# Patient Record
Sex: Female | Born: 1965 | Race: White | Hispanic: No | Marital: Married
Health system: Midwestern US, Community
[De-identification: ages and names within clinical notes are randomized; demographics above are authoritative.]

## PROBLEM LIST (undated history)

## (undated) DIAGNOSIS — E785 Hyperlipidemia, unspecified: Secondary | ICD-10-CM

## (undated) DIAGNOSIS — Z8489 Family history of other specified conditions: Secondary | ICD-10-CM

## (undated) DIAGNOSIS — Z87442 Personal history of urinary calculi: Secondary | ICD-10-CM

## (undated) DIAGNOSIS — E119 Type 2 diabetes mellitus without complications: Secondary | ICD-10-CM

## (undated) DIAGNOSIS — Z9889 Other specified postprocedural states: Secondary | ICD-10-CM

## (undated) DIAGNOSIS — K219 Gastro-esophageal reflux disease without esophagitis: Secondary | ICD-10-CM

## (undated) DIAGNOSIS — J45909 Unspecified asthma, uncomplicated: Secondary | ICD-10-CM

## (undated) DIAGNOSIS — T7840XA Allergy, unspecified, initial encounter: Secondary | ICD-10-CM

## (undated) DIAGNOSIS — I10 Essential (primary) hypertension: Secondary | ICD-10-CM

## (undated) DIAGNOSIS — G4730 Sleep apnea, unspecified: Secondary | ICD-10-CM

## (undated) DIAGNOSIS — R112 Nausea with vomiting, unspecified: Secondary | ICD-10-CM

## (undated) HISTORY — DX: Unspecified asthma, uncomplicated: J45.909

## (undated) HISTORY — DX: Allergy, unspecified, initial encounter: T78.40XA

## (undated) HISTORY — PX: OTHER SURGICAL HISTORY: SHX169

## (undated) HISTORY — DX: Essential (primary) hypertension: I10

## (undated) HISTORY — PX: BREAST EXCISIONAL BIOPSY: SUR124

## (undated) HISTORY — PX: ABDOMINAL HYSTERECTOMY: SHX81

## (undated) HISTORY — PX: BREAST SURGERY: SHX581

## (undated) HISTORY — DX: Hyperlipidemia, unspecified: E78.5

---

## 2015-03-22 ENCOUNTER — Inpatient Hospital Stay: Attending: Thoracic Surgery (Cardiothoracic Vascular Surgery)

## 2015-03-22 LAB — CBC
Hematocrit: 38.9 % (ref 36.0–48.0)
Hemoglobin: 12.9 g/dL (ref 12.0–16.0)
MCH: 29.8 pg (ref 26.0–34.0)
MCHC: 33.2 g/dL (ref 31.0–36.0)
MCV: 89.8 fL (ref 80.0–100.0)
MPV: 8 fL (ref 5.0–10.5)
Platelets: 279 10*3/uL (ref 135–450)
RBC: 4.33 M/uL (ref 4.00–5.20)
RDW: 13.8 % (ref 12.4–15.4)
WBC: 7.6 10*3/uL (ref 4.0–11.0)

## 2015-03-22 LAB — TYPE AND SCREEN
ABO/Rh: A POS
Antibody Screen: NEGATIVE

## 2015-03-22 LAB — BASIC METABOLIC PANEL
Anion Gap: 13 (ref 3–16)
BUN: 16 mg/dL (ref 7–20)
CO2: 26 mmol/L (ref 21–32)
Calcium: 9.1 mg/dL (ref 8.3–10.6)
Chloride: 102 mmol/L (ref 99–110)
Creatinine: 0.5 mg/dL — ABNORMAL LOW (ref 0.6–1.1)
GFR African American: >60 (ref >60)
GFR Non-African American: >60 (ref >60)
Glucose: 123 mg/dL — ABNORMAL HIGH (ref 70–99)
Potassium: 4.1 mmol/L (ref 3.5–5.1)
Sodium: 141 mmol/L (ref 136–145)

## 2015-03-25 NOTE — Patient Instructions (Signed)
[facility] PRE-OPERATIVE INSTRUCTIONS    Arrival time_____0600_______        Surgery time_0730___________    Take the following medications with a sip of water:  Use alvesco inhaler and bring albuterol inhaler and Cpap    Do not eat or drink anything after 12:00 midnight prior to your surgery.  This includes water chewing gum, mints and ice chips.   You may brush your teeth and gargle the morning of your surgery, but do not swallow the water     Please see your family doctor/pediatrician for a history and physical and/or concerning medications.   Bring any test results/reports from your physicians office.   If you are under the care of a heart doctor or specialist doctor, please be aware that you may be asked to them for clearance    You may be asked to stop blood thinners such as Coumadin, Plavix, Fragmin, Lovenox, etc., or any anti-inflammatories such as:  Aspirin, Ibuprofen, Advil, Naproxen prior to your surgery.    We also ask that you stop any OTC medications such as fish oil, vitamin E, glucosamine, garlic, Multivitamins, COQ 10, etc.    We ask that you do not smoke 24 hours prior to surgery  We ask that you do not  drink any alcoholic beverages 24 hours prior to surgery     You must make arrangements for a responsible adult to take you home after your surgery.    For your safety you will not be allowed to leave alone or drive yourself home.  Your surgery will be cancelled if you do not have a ride home.     Also for your safety, it is strongly suggested that someone stay with you the first 24 hours after your surgery.     A parent or legal guardian must accompany a child scheduled for surgery and plan to stay at the hospital until the child is discharged.    Please do not bring other children with you.    For your comfort, please wear simple loose fitting clothing to the hospital.  Please do not bring valuables.    Do not wear any make-up or nail polish on your fingers or toes      For your safety,  please do not wear any jewelry or body piercing's on the day of surgery.   All jewelry must be removed.      If you have dentures, they will be removed before going to operating room.    For your convenience, we will provide you with a container.    If you wear contact lenses or glasses, they will be removed, please bring a case for them.     If you have a living will and a durable power of attorney for healthcare, please bring in a copy.     As part of our patient safety program to minimize surgical site infections, we ask you to do the following:    ?? Please notify your surgeon if you develop any illness between         now and the  day of your surgery.    ?? This includes a cough, cold, fever, sore throat, nausea,         or vomiting, and diarrhea, etc.  ??  Please notify your surgeon if you experience dizziness, shortness         of breath or blurred vision between now and the time of your surgery.  Do not shave your operative site 96 hours prior to surgery.   For face and neck surgery, men may use an electric razor 48 hours   prior to surgery.    You may shower the night before surgery or the morning of   your surgery with an antibacterial soap.    You will need to bring a photo ID and insurance card    [facility] has an onsite pharmacy, would you like to utilize our pharmacy     If you will be staying overnight and use a C-pap machine, please bring   your C-pap to hospital     Our goal is to provide you with excellent care, therefore, visitors will be limited to two(2) in the room at a time so that we may focus on providing this care for you.          Please contact pre-admission testing if you have any further questions.                 [facility] phone number:  [phone]     [facility] fax number:  [phone]  Please note these are generalized instructions for all surgical cases, you may be provided with more specific instructions according to your surgery.

## 2015-03-25 NOTE — Progress Notes (Signed)
C-Difficile admission screening and protocol:     * Admitted with diarrhea?  no     *Prior history of C-Diff. In last 3 months?  no     *Antibiotic use in the past 6-8 weeks?  no     *Prior hospitalization or nursing home in the last month?    no

## 2015-03-26 ENCOUNTER — Inpatient Hospital Stay: Attending: Obstetrics & Gynecology

## 2015-03-26 DIAGNOSIS — N939 Abnormal uterine and vaginal bleeding, unspecified: Secondary | ICD-10-CM

## 2015-03-26 LAB — POCT GLUCOSE: POC Glucose: 167 mg/dl — ABNORMAL HIGH (ref 70–99)

## 2015-03-26 LAB — POC PREGNANCY UR-QUAL: Pregnancy, Urine: NEGATIVE

## 2015-03-26 MED ORDER — OXYCODONE-ACETAMINOPHEN 5-325 MG PO TABS
5-325 MG | ORAL | Status: AC | PRN
Start: 2015-03-26 — End: 2015-03-26

## 2015-03-26 MED ORDER — SODIUM CHLORIDE 0.9 % IV SOLN
0.9 % | INTRAVENOUS | Status: DC
Start: 2015-03-26 — End: 2015-03-27
  Administered 2015-03-26 (×2): via INTRAVENOUS

## 2015-03-26 MED ORDER — FAMOTIDINE 20 MG/2ML IV SOLN
20 MG/2ML | Freq: Once | INTRAVENOUS | Status: AC
Start: 2015-03-26 — End: 2015-03-26
  Administered 2015-03-26: 12:00:00 via INTRAVENOUS

## 2015-03-26 MED ORDER — MIDAZOLAM HCL 2 MG/2ML IJ SOLN
2 MG/ML | INTRAMUSCULAR | Status: AC
Start: 2015-03-26 — End: ?

## 2015-03-26 MED ORDER — VECURONIUM BROMIDE 10 MG IV SOLR
10 MG | INTRAVENOUS | Status: AC
Start: 2015-03-26 — End: ?

## 2015-03-26 MED ORDER — FENTANYL CITRATE (PF) 100 MCG/2ML IJ SOLN
100 MCG/2ML | INTRAMUSCULAR | Status: DC | PRN
Start: 2015-03-26 — End: 2015-03-27

## 2015-03-26 MED ORDER — HYDROMORPHONE 0.5MG/0.5ML IJ SOLN
1 MG/ML | Status: DC | PRN
Start: 2015-03-26 — End: 2015-03-27

## 2015-03-26 MED ORDER — ONDANSETRON HCL 4 MG/2ML IJ SOLN
4 MG/2ML | INTRAMUSCULAR | Status: AC
Start: 2015-03-26 — End: ?

## 2015-03-26 MED ORDER — NORMAL SALINE FLUSH 0.9 % IV SOLN
0.9 % | INTRAVENOUS | Status: DC | PRN
Start: 2015-03-26 — End: 2015-03-27

## 2015-03-26 MED ORDER — LUBRIFRESH P.M. OP OINT
OPHTHALMIC | Status: AC
Start: 2015-03-26 — End: ?

## 2015-03-26 MED ORDER — SUCCINYLCHOLINE CHLORIDE 20 MG/ML IJ SOLN
20 MG/ML | INTRAMUSCULAR | Status: AC
Start: 2015-03-26 — End: ?

## 2015-03-26 MED ORDER — KETOROLAC TROMETHAMINE 30 MG/ML IJ SOLN
30 MG/ML | INTRAMUSCULAR | Status: AC
Start: 2015-03-26 — End: ?

## 2015-03-26 MED ORDER — BUPIVACAINE HCL (PF) 0.5 % IJ SOLN
0.5 % | INTRAMUSCULAR | Status: AC
Start: 2015-03-26 — End: ?

## 2015-03-26 MED ORDER — SUGAMMADEX SODIUM 200 MG/2ML IV SOLN
200 MG/2ML | INTRAVENOUS | Status: AC
Start: 2015-03-26 — End: ?

## 2015-03-26 MED ORDER — OXYCODONE-ACETAMINOPHEN 5-325 MG PO TABS
5-325 MG | ORAL | Status: AC | PRN
Start: 2015-03-26 — End: 2015-03-26
  Administered 2015-03-26: 18:00:00 1 via ORAL

## 2015-03-26 MED ORDER — DEXTROSE 5 % IV SOLN (MINI-BAG)
5 % | Freq: Once | INTRAVENOUS | Status: DC
Start: 2015-03-26 — End: 2015-03-26

## 2015-03-26 MED ORDER — SODIUM CHLORIDE 0.9 % IJ SOLN
0.9 % | INTRAMUSCULAR | Status: AC
Start: 2015-03-26 — End: ?

## 2015-03-26 MED ORDER — PROPOFOL 200 MG/20ML IV EMUL
200 MG/20ML | INTRAVENOUS | Status: AC
Start: 2015-03-26 — End: ?

## 2015-03-26 MED ORDER — NORMAL SALINE FLUSH 0.9 % IV SOLN
0.9 % | Freq: Two times a day (BID) | INTRAVENOUS | Status: DC
Start: 2015-03-26 — End: 2015-03-27

## 2015-03-26 MED ORDER — ONDANSETRON HCL 4 MG/2ML IJ SOLN
4 MG/2ML | Freq: Once | INTRAMUSCULAR | Status: AC | PRN
Start: 2015-03-26 — End: 2015-03-26

## 2015-03-26 MED ORDER — HYDROMORPHONE HCL 1 MG/ML IJ SOLN
1 MG/ML | INTRAMUSCULAR | Status: AC
Start: 2015-03-26 — End: ?

## 2015-03-26 MED ORDER — HEPARIN SODIUM (PORCINE) 5000 UNIT/ML IJ SOLN
5000 UNIT/ML | Freq: Once | INTRAMUSCULAR | Status: AC
Start: 2015-03-26 — End: 2015-03-26
  Administered 2015-03-26: 12:00:00 via SUBCUTANEOUS

## 2015-03-26 MED ORDER — APREPITANT 40 MG PO CAPS
40 MG | Freq: Once | ORAL | Status: AC
Start: 2015-03-26 — End: 2015-03-26
  Administered 2015-03-26: 12:00:00 40 mg via ORAL

## 2015-03-26 MED ORDER — FENTANYL CITRATE (PF) 100 MCG/2ML IJ SOLN
100 MCG/2ML | INTRAMUSCULAR | Status: AC
Start: 2015-03-26 — End: ?

## 2015-03-26 MED ORDER — LACTATED RINGERS IV SOLN
INTRAVENOUS | Status: AC
Start: 2015-03-26 — End: ?

## 2015-03-26 MED ORDER — DEXAMETHASONE SODIUM PHOSPHATE 20 MG/5ML IJ SOLN
20 MG/5ML | INTRAMUSCULAR | Status: AC
Start: 2015-03-26 — End: ?

## 2015-03-26 MED ORDER — DEXTROSE 5 % IV SOLN (MINI-BAG)
5 % | Freq: Once | INTRAVENOUS | Status: AC
Start: 2015-03-26 — End: 2015-03-26
  Administered 2015-03-26: 12:00:00 2 g via INTRAVENOUS

## 2015-03-26 MED ORDER — OXYCODONE-ACETAMINOPHEN 5-325 MG PO TABS
5-325 MG | ORAL_TABLET | Freq: Three times a day (TID) | ORAL | 0 refills | Status: AC | PRN
Start: 2015-03-26 — End: ?

## 2015-03-26 MED FILL — FAMOTIDINE 20 MG/2ML IV SOLN: 20 MG/2ML | INTRAVENOUS | Qty: 2

## 2015-03-26 MED FILL — MIDAZOLAM HCL 2 MG/2ML IJ SOLN: 2 MG/ML | INTRAMUSCULAR | Qty: 2

## 2015-03-26 MED FILL — FRESENIUS PROPOVEN 200 MG/20ML IV EMUL: 200 MG/20ML | INTRAVENOUS | Qty: 20

## 2015-03-26 MED FILL — SODIUM CHLORIDE 0.9 % IV SOLN: 0.9 % | INTRAVENOUS | Qty: 1000

## 2015-03-26 MED FILL — SODIUM CHLORIDE 0.9 % IJ SOLN: 0.9 % | INTRAMUSCULAR | Qty: 10

## 2015-03-26 MED FILL — EMEND 40 MG PO CAPS: 40 MG | ORAL | Qty: 1

## 2015-03-26 MED FILL — BUPIVACAINE HCL (PF) 0.5 % IJ SOLN: 0.5 % | INTRAMUSCULAR | Qty: 30

## 2015-03-26 MED FILL — ONDANSETRON HCL 4 MG/2ML IJ SOLN: 4 MG/2ML | INTRAMUSCULAR | Qty: 2

## 2015-03-26 MED FILL — QUELICIN 20 MG/ML IJ SOLN: 20 MG/ML | INTRAMUSCULAR | Qty: 10

## 2015-03-26 MED FILL — HYDROMORPHONE HCL 1 MG/ML IJ SOLN: 1 MG/ML | INTRAMUSCULAR | Qty: 1

## 2015-03-26 MED FILL — LUBRIFRESH P.M. OP OINT: OPHTHALMIC | Qty: 3.5

## 2015-03-26 MED FILL — HEPARIN SODIUM (PORCINE) 5000 UNIT/ML IJ SOLN: 5000 UNIT/ML | INTRAMUSCULAR | Qty: 1

## 2015-03-26 MED FILL — LACTATED RINGERS IV SOLN: INTRAVENOUS | Qty: 1000

## 2015-03-26 MED FILL — CEFOXITIN SODIUM 2 G IV SOLR: 2 g | INTRAVENOUS | Qty: 2

## 2015-03-26 MED FILL — OXYCODONE-ACETAMINOPHEN 5-325 MG PO TABS: 5-325 MG | ORAL | Qty: 1

## 2015-03-26 MED FILL — BRIDION 200 MG/2ML IV SOLN: 200 MG/2ML | INTRAVENOUS | Qty: 2

## 2015-03-26 MED FILL — KETOROLAC TROMETHAMINE 30 MG/ML IJ SOLN: 30 MG/ML | INTRAMUSCULAR | Qty: 1

## 2015-03-26 MED FILL — DEXAMETHASONE SODIUM PHOSPHATE 20 MG/5ML IJ SOLN: 20 MG/5ML | INTRAMUSCULAR | Qty: 5

## 2015-03-26 MED FILL — FENTANYL CITRATE (PF) 100 MCG/2ML IJ SOLN: 100 MCG/2ML | INTRAMUSCULAR | Qty: 2

## 2015-03-26 MED FILL — VECURONIUM BROMIDE 10 MG IV SOLR: 10 MG | INTRAVENOUS | Qty: 10

## 2015-03-26 MED FILL — FLUORESCITE 10 % IJ SOLN: 10 % | INTRAMUSCULAR | Qty: 5

## 2015-03-26 NOTE — Progress Notes (Signed)
Up to bathroom & voided qs. Pt & husband verbalized understanding of discharge instructions.

## 2015-03-26 NOTE — Anesthesia Pre-Procedure Evaluation (Signed)
Department of Anesthesiology  Preprocedure Note       Name:  Jean Francis   Age:  50 y.o.  DOB:  1965-05-13                                          MRN:  [id number]         Date:  03/26/2015        [facility] Department of Anesthesiology  Pre-Anesthesia Evaluation/Consultation       Name:  Jean Francis                                         Age:  50 y.o.  MRN:  [id number]           Procedure (Scheduled):  Robotic hysterectomy, BSO, cysto  Surgeon:  Dr. Louis Meckel     No Known Allergies  There is no problem list on file for this patient.    Past Medical History   Diagnosis Date   ??? Anesthesia      pt stated her father aspirated on OR table/upper GI bleed after another surgery/both mother and father became nauseated after surgery   ??? Asthma    ??? Diabetes mellitus (HCC) 20 years ago     gestational diabetes--borderline   ??? GERD (gastroesophageal reflux disease)    ??? Sleep apnea      does use Cpap--instructed to bring DOS   ??? Wears glasses      Past Surgical History   Procedure Laterality Date   ??? Breast surgery Right 2015     breast biopsy   ??? Hysteroscopy  2014     Social History   Substance Use Topics   ??? Smoking status: Never Smoker   ??? Smokeless tobacco: None      Comment: pt states grew up in second hand smoke   ??? Alcohol use Yes      Comment: rare     Medications  Current Outpatient Prescriptions on File Prior to Encounter   Medication Sig Dispense Refill   ??? montelukast (SINGULAIR) 10 MG tablet Take 10 mg by mouth nightly     ??? fexofenadine (ALLEGRA) 180 MG tablet Take 180 mg by mouth nightly     ??? Probiotic Product (PROBIOTIC ADVANCED PO) Take 1 tablet by mouth daily     ??? omeprazole (PRILOSEC) 10 MG delayed release capsule Take 20 mg by mouth 2 times daily     ??? Ciclesonide (ALVESCO) 160 MCG/ACT AERS Inhale 1 puff into the lungs 2 times daily     ??? Multiple Vitamins-Minerals (THERAPEUTIC MULTIVITAMIN-MINERALS) tablet Take 1 tablet by mouth daily     ??? ALBUTEROL IN Inhale into the lungs       No current  facility-administered medications on file prior to encounter.      Current Outpatient Prescriptions   Medication Sig Dispense Refill   ??? montelukast (SINGULAIR) 10 MG tablet Take 10 mg by mouth nightly     ??? fexofenadine (ALLEGRA) 180 MG tablet Take 180 mg by mouth nightly     ??? Probiotic Product (PROBIOTIC ADVANCED PO) Take 1 tablet by mouth daily     ??? omeprazole (PRILOSEC) 10 MG delayed release capsule Take 20 mg by mouth 2 times daily     ??? Ciclesonide (ALVESCO)  160 MCG/ACT AERS Inhale 1 puff into the lungs 2 times daily     ??? Multiple Vitamins-Minerals (THERAPEUTIC MULTIVITAMIN-MINERALS) tablet Take 1 tablet by mouth daily     ??? ALBUTEROL IN Inhale into the lungs       Current Facility-Administered Medications   Medication Dose Route Frequency Provider Last Rate Last Dose   ??? 0.9 % sodium chloride infusion   Intravenous Continuous Tonie Griffith, MD       ??? famotidine (PEPCID) injection 20 mg  20 mg Intravenous Once Tonie Griffith, MD       ??? sodium chloride flush 0.9 % injection 10 mL  10 mL Intravenous 2 times per day Tonie Griffith, MD       ??? sodium chloride flush 0.9 % injection 10 mL  10 mL Intravenous PRN Tonie Griffith, MD       ??? heparin (porcine) injection 5,000 Units  5,000 Units Subcutaneous Once Carter Kitten, MD       ??? cefOXitin (MEFOXIN) 2 g in dextrose 5% 50 mL (mini-bag)  2 g Intravenous Once Carter Kitten, MD         Vital Signs (Current)   Vitals:    03/26/15 0620   BP: (!) 158/90   Pulse: 84   Resp: 16   Temp: 98.2 ??F (36.8 ??C)   SpO2: 97%     Vital Signs Statistics (for past 48 hrs)     BP  Min: 158/90   Min taken time: 03/26/15 0620  Max: 158/90   Max taken time: 03/26/15 0620  Temp  Avg: 98.2 ??F (36.8 ??C)  Min: 98.2 ??F (36.8 ??C)   Min taken time: 03/26/15 0620  Max: 98.2 ??F (36.8 ??C)   Max taken time: 03/26/15 0620  Pulse  Avg: 84  Min: 84   Min taken time: 03/26/15 0620  Max: 84   Max taken time: 03/26/15 0620  Resp  Avg: 16  Min: 16   Min  taken time: 03/26/15 0620  Max: 16   Max taken time: 03/26/15 0620  SpO2  Avg: 97 %  Min: 97 %   Min taken time: 03/26/15 0620  Max: 97 %   Max taken time: 03/26/15 0620    BP Readings from Last 3 Encounters:   03/26/15 (!) 158/90     BMI  Body mass index is 33.27 kg/(m^2).  Estimated body mass index is 33.27 kg/(m^2) as calculated from the following:    Height as of this encounter:  (1.575 m).    Weight as of this encounter: 181 lb 14.1 oz (82.5 kg).    CBC   Lab Results   Component Value Date    WBC 7.6 03/22/2015    RBC 4.33 03/22/2015    HGB 12.9 03/22/2015    HCT 38.9 03/22/2015    MCV 89.8 03/22/2015    RDW 13.8 03/22/2015    PLT 279 03/22/2015     CMP    Lab Results   Component Value Date    NA 141 03/22/2015    K 4.1 03/22/2015    CL 102 03/22/2015    CO2 26 03/22/2015    BUN 16 03/22/2015    CREATININE 0.5 03/22/2015    GFRAA >60 03/22/2015    LABGLOM >60 03/22/2015    GLUCOSE 123 03/22/2015    CALCIUM 9.1 03/22/2015     BMP    Lab Results   Component Value Date    NA  141 03/22/2015    K 4.1 03/22/2015    CL 102 03/22/2015    CO2 26 03/22/2015    BUN 16 03/22/2015    CREATININE 0.5 03/22/2015    CALCIUM 9.1 03/22/2015    GFRAA >60 03/22/2015    LABGLOM >60 03/22/2015    GLUCOSE 123 03/22/2015     POCGlucose  No results for input(s): GLUCOSE in the last 72 hours.   Coags  No results found for: PROTIME, INR, APTT  HCG (If Applicable) No results found for: PREGTESTUR, PREGSERUM, HCG, HCGQUANT   ABGs No results found for: PHART, PO2ART, PCO2ART, HCO3ART, BEART, O2SATART   Type & Screen (If Applicable)  No results found for: LABABO, LABRH    NPO Status: Time of last liquid consumption: 2300                        Time of last solid consumption: 1930                        Date of last liquid consumption: 03/25/15                        Date of last solid food consumption: 03/25/15    BMI:   Wt Readings from Last 3 Encounters:   03/26/15 181 lb 14.1 oz (82.5 kg)   03/25/15 180 lb (81.6 kg)     Body mass  index is 33.27 kg/(m^2).    Anesthesia Evaluation  Patient summary reviewed no history of anesthetic complications:   Airway: Mallampati: II  TM distance: >3 FB   Neck ROM: full  Mouth opening: > = 3 FB Dental: normal exam         Pulmonary: breath sounds clear to auscultation  (+) sleep apnea: on CPAP,  asthma:     (-) shortness of breath and recent URI       Cardiovascular:        (-) hypertension, valvular problems/murmurs, past MI, CAD, CABG/stent, dysrhythmias,  angina,  CHF, orthopnea and murmur      Rhythm: regular  Rate: normal              Beta Blocker:  Not on Beta Blocker   Neuro/Psych:      (-) seizures, neuromuscular disease, TIA, CVA and headaches  GI/Hepatic/Renal:   (+) GERD: poorly controlled,      (-) PUD, hepatitis and liver disease     Endo/Other:    (+) Type II DM, ,     (-) hypothyroidism, hyperthyroidism, blood dyscrasia    Abdominal:   (+) obese,                  Anesthesia Plan    ASA 3     general     intravenous induction   Anesthetic plan and risks discussed with patient and spouse.    Plan discussed with CRNA.            DOS STAFF ADDENDUM:    Pt seen and examined, chart reviewed (including anesthesia, drug and allergy history).  No interval changes to history and physical examination.  Anesthetic plan, risks, benefits, alternatives, and personnel involved discussed with patient.  Patient verbalized an understanding and agrees to proceed.      Alden Hipp, MD  March 26, 2015  6:36 AM      Alden Hipp, MD   03/26/2015

## 2015-03-26 NOTE — Progress Notes (Signed)
Up to bathroom & voided qs. Up in chair

## 2015-03-26 NOTE — H&P (Signed)
Pt was seen and examined, no changes to paper H&P  Electronically signed by Margaretha Seeds, MD on 03/26/2015 at 7:19 AM

## 2015-03-26 NOTE — Progress Notes (Signed)
1.  Patient is identified using name and date of birth.  2.  The patient is free from signs and symptoms of injury.  3.  The patient receives appropriate medication(s), safely administered during the perioperative period.  4.  The patient had wound/tissuue perfusion consistent with or improved from baseline levels established preoperatively.  5.  The patient is at or returning to normothermia at the conclusion of the immediate postoperative period.  6.  The patient's fluid, electrolyte, and acid base balances are consistent with or improved from baseline levels established preoperatively.  7.  The patient's pulmonary function is consistent with or improved from baseline levels established preoperatively.  8.  The patient's cardiovascular status is consistent with or improved from baseline levels established preoperatively.  9.  The patient/caregiver participates in decisions affecting his or her perioperative care.  10.  The patient's care is consistent with the individualized perioperative plan of care.  11.  The patient's right to privacy is maintained.  12.  The patient is the recipient of competent and ethical care within legal standards of practice.  13.  The patient's value system, lifestyle, ethnicity, and culture are considered, respected, and incorporated in the perioperative plan of care.  14.  The patient demonstrates and/or reports adequate pain control throughout the perioperative period.  15.  The patient's neurological status is consistent with or improved from baseline levels established preoperatively.  16.  The patient/caregiver demonstrates knowledge of the expected responses to the operative or invasive procedure.  17.  Patient/caregiver has reduced anxiety.  Interventions - familiarize with environment and equipment.

## 2015-03-26 NOTE — Anesthesia Post-Procedure Evaluation (Signed)
[facility] Department of Anesthesiology  Post-Anesthesia Note       Name:  Jean Francis                                         Age:  50 y.o.  MRN:  [id number]     Last Vitals & Oxygen Saturation:   Visit Vitals   ??? BP (!) 136/99   ??? Pulse 108   ??? Temp 97.5 ??F (36.4 ??C) (Temporal)   ??? Resp 16   ??? Ht  (1.575 m)   ??? Wt 181 lb 14.1 oz (82.5 kg)   ??? LMP 03/13/2015   ??? SpO2 97%   ??? BMI 33.27 kg/m2     Patient Vitals for the past 4 hrs:   BP Temp Temp src Pulse Resp SpO2   03/26/15 1202 (!) 136/99 97.5 ??F (36.4 ??C) Temporal 108 16 97 %   03/26/15 1047 (!) 140/91 - - 106 11 94 %   03/26/15 1042 (!) 141/85 - - 110 12 93 %   03/26/15 1037 136/83 - - 107 12 94 %   03/26/15 1035 - - - 109 12 95 %   03/26/15 1030 (!) 145/85 - - 110 13 96 %   03/26/15 1027 - - - 113 12 96 %   03/26/15 1024 (!) 151/87 97.3 ??F (36.3 ??C) Temporal 116 12 96 %       Level of consciousness: awake, alert and oriented    Respiratory: stable     Cardiovascular: stable     Hydration: stable     PONV: stable     Post-op pain: adequate analgesia    Post-op assessment: no apparent anesthetic complications    Complications:  none    Alden Hipp, MD  March 26, 2015   12:07 PM

## 2015-03-26 NOTE — Discharge Summary (Signed)
Department of Obstetrics and Gynecology  Gynecologic Discharge Summary      Admit Date: 03/26/2015    Admit Diagnosis: 1. AUB  2. Fibroid uterus  3. Uterine polyp    Discharge Date: 03/25/14     Discharge Diagnoses: s/p RA TLH with BL salpingectomy, cystoscopy     Dhamar, Gregory   Home Medication Instructions ZOX:[id number]    Printed on:03/26/15 1008   Medication Information                      ALBUTEROL IN  Inhale into the lungs             Ciclesonide (ALVESCO) 160 MCG/ACT AERS  Inhale 1 puff into the lungs 2 times daily             fexofenadine (ALLEGRA) 180 MG tablet  Take 180 mg by mouth nightly             montelukast (SINGULAIR) 10 MG tablet  Take 10 mg by mouth nightly             Multiple Vitamins-Minerals (THERAPEUTIC MULTIVITAMIN-MINERALS) tablet  Take 1 tablet by mouth daily             omeprazole (PRILOSEC) 10 MG delayed release capsule  Take 20 mg by mouth 2 times daily             oxyCODONE-acetaminophen (PERCOCET) 5-325 MG per tablet  Take 1 tablet by mouth every 8 hours as needed for Pain .             Probiotic Product (PROBIOTIC ADVANCED PO)  Take 1 tablet by mouth daily                 Service: Gynecology    Consults: none    Significant Diagnostic Studies: none    Treatments: IV hydration, abxs x1    Condition at Discharge: good    Hospital Course: uncomplicated    Discharge Instructions:    Activity: no lifting, or Strenuous exercise for 8 weeks  Diet: regular diet    Instructions: No driving while using pain medications, no heavy lifting, vaginal rest for 8 weeks, keep incision clean and dry, call office if have fever>100.4 F, inability to void for >6 h, severe abdominal pain, vaginal bleeding, or constipation.       Discharge to: Home    Disposition / Followup: Return to office in 1 week    Electronically signed by Margaretha Seeds, MD on 03/26/2015 at 10:08 AM

## 2015-03-26 NOTE — Op Note (Signed)
Date of surgery: 03/26/15  Pre-operative Diagnosis: 1. Abnormal uterine bleeding  2. Pelvic pain  3. Dispareunia  4. Uterine fibroids 5. Endometrial polyp  Post-operative Diagnosis: the same  Procedure: 1.Da Vinci total laparoscopic hysterectomy with bilateral salpingectomy 2. Cystoscopy  Anesthesia: General  Antibiotics: Ancef  IVF: Fluorescein 0.25 mg diluted with 10 ml saline IV for cystoscopy  Surgeon: Dr Dorris Carnes. Ammanda Dobbins  Assistant(s): Gardiner Ramus, SA  Findings: Enlarged (approximately to 13 weeks) irregularly shaped uterus, normal bilateral ovaries and tubes. Bilateral ureteral spill noted on cystoscopy  Complications: none  Specimens: uterus/cervix with bilateral tubes   Urine Output: 300 ml  Estimated blood loss: 100 mL      INDICATIONS: The patient is a 50 year-old Caucasian female, G3P3 with the long standing history of abnormal uterine bleeding, pelvic pain and dispareunia. Pelvic ultrasound and saline sonogram that were done in the office were c/w presence of multiple intramural and subserosal fibroids along with large endometrial polyp. Jean Francis was reluctant to start BCP for medical management as had multiple side effects in the past and does have elevated BP. She expressed the strong desire for operative procedure as a definite mode of treatment of her symptoms that has been significantly interfering with quality of her life. The patient had been consulted about risks, benefits, and possible complications of the surgery including but not limited to infection, allergic reaction, disfiguring scar, severe loss of blood, loss of function of any limb or organ, paralysis, paraplegia or quadroplegia, brain damage, cardiac arrest, death, thrombosis, injury to bowel, bladder, fistula, injury to blood vessels, need for transfusion which carries risk for HIV or hepatitis, pelvic pain, adhesive disease or scar tissue, embolism, herniation of incision site. Patient agreed with risks and elected to proceed by signing an  informed consent.   Considering presence of family history of gynecologic malignancy, the fallopian tubes will be removed as the risk reduction option but ovaries will be salvaged.     PROCEDURE: The patient was taken to the operating room with IV running and pneumoboots applied to her lower extremities. She was then placed in a dorsal supine position and general anesthesia was administered without difficulty and was adequate. OG tube was placed in the stomach.     Next, the patient was placed in a lithotomy position with her legs in [place] stirrups. An examination under anesthesia revealed a mobile anteverted uterus, enlarged to 13 weeks pregnancy, with irregular surface. Following that, she was prepped and draped in the regular sterile fashion. A Foley catheter was placed in her bladder.    Time-out was performed identifying the appropriate patient's name and the type of procedure.     The weighted speculum was placed in the vagina, cervix was grasped with Allis clamp, cervical canal was dilated and medium size Fornices uterine manipulator was placed in the uterine cavity and secured to the cervix. Steep Trendelenburg position was accomplished.  Next, gloves were changed and attention was turned to the patient's abdomen.     A 10 mm supraumbilical incision was made after infiltration of skin with 0.5% marcaine, abdominal wall was tented up and the Veress needle was placed into the abdominal cavity. Pneumoperitoneum was establish with CO 2 gas to pressure of 15 mmHg. 10 mm bladeless trocar was placed into the abdomen and appropriate intra-abdominal placement was confirmed with the camera. Underlying bowel and omentum were evaluated, no bleeding or injury was noted.  Following that, three additional 8 mm ports were placed under direct visualization, after infiltration  of skin with 0.5% marcaine, port #1 for the monopolar scissors, port #2 for bipolar dissector, port #3 for suction irrigator.   The patient was placed  in a steep Trendelenburg position, da Vinci system was brought to the patient's left side and side-docking was accomplished without difficulty. All ports were connected to the robotic arms. At this point surgeon took his place at the console.     Pelvis was thoroughly evaluated and abovementioned findings were noted. The uterus was grasped with the grasper at the fundus and deviated to the patient's right side. The ureter was identified on the patient's left side with peristalsis over the pelvic brim. Using bipolar and monopolar dissectors the left fallopian tube was carefully dissected from the left ovary avoiding interruption of left IP ligament, down to the cornual end. Next, we proceeded across the left round and  uteroovarian ligaments, that in the same manner were coagulated and dissected. Next, the bladder flap was then developed on the patients left side and across the midline and the bladder was brought down below the level of cuff manipulator. Following that, posterior leaf of left broad ligament was taken down to the level of right USL and left uterine vessels were skeletonized. Following that, uterine artery and vein were coagulated on the patient's left side at the level of internal cervical os and transected with achievement of good hemostasis. Then we proceeded across the left cardinal ligament with extra attention paid to stay medially from the uterine vasculature closer to the cervix. As of note, during these steps of procedure the uterus was pushed up in the pelvis, facilitating the dissection and keeping ureters away.     Next attention was then turned to the patient's right side. Uterus was deviated to the patient's left by assistant. The ureter was identified on the patient's right side with peristalsis over the pelvic brim. I went ahead and proceeded across mesosalpinx, then uteroovarian ligament, then round ligament with subsequent development of the bladder flap on the right side, and bladder  was brought down. Uterine vessels on the right side were skeletonized, coagulated and dissected with achievement of good hemostasis. Next, we proceeded across the right cardinal ligament while uterus was pushed all the way up till we reached the cuff manipulator. After instruments were repositioned, I scored vaginal cuff anteriorly and patient's vagina was entered. Using monopolar scissors I then circumferentially cut across the vaginal cuff staying within the grove of the plastic manipulator. Uterus was amputated and after additional manipulation, was brought into vagina. There was a small beeder noted on posterior cuff and was controlled with cautery.     Next vaginal cuff was closed using 0 Vicryl sutures in interrupted fashion. Both uterosacral ligaments were incorporated into the cuff angles sutures. Excellent hemostasis was achieved. Pelvic was then irrigated, and all the pedicles were dry. We dropped the CO2 pressure to ensure there was still no bleeding in the abdomen and it was none.The uterus was removed from the vagina.   Following that, two 10 mm port's fascia was closed with #0 Vicryl. Next, all the ports were removed from the abdomen under direct visualization. Pneumoperitoneum was released and patient was returned to the flat position. The skin across all the ports was closed with #4-0 Vicryl.    At the conclusion of the surgery the cystoscopy was done, using a 70 degree cystoscope. The bladder was filled with approximately 250 mL of sterile water. Bladder integrity was noted. There was no evidence of injury or  perforation. The bladder mucosa was normal. Transurethral ridge was within normal limits. Both ureters were identified and noted to be effluxing yellow urine (fluorescein was given IV during cuff closure).   The patient tolerated the procedure well. Instrument and lap count was correct x 2. The patient was taken to recovery in satisfactory condition. She will be sent home today if has no  significant side effects from anesthetics.     Electronically signed by Margaretha Seeds, MD on 03/26/2015 at 12:44 PM

## 2015-03-26 NOTE — Progress Notes (Signed)
1.  Identify name and date of birth.  2.  Patient remains free from signs and symptoms of injury.  3.  Patient receives appropriate medication(s), safely administered during the       Perioperative period.  4.  The patient is free from signs and symptoms of infection.  5.  The patient has wound/tissur perfusion.  6.  The patient's fluid, electrolyte, and acid-base balances are established perioperatively.  7.  The patient's pulmonary function is established preoperatively.  8.  The patient's cardiovascular status is established perioperatively.  9.  The patient/caregiver demonstrates knowledge of nutritional management related to the operative or other invasive procedure.  10.  The patient/caregiver demonstrates knowledge of medication management.  11.  The patient/caregiver demonstrates knowledge of pain management.  12.  The patient participates in the rehabilitation process as applicable.  13.  The patient/caregiver participates in decisions in decisions affecting his or her Perioperative plan of care.  14.  The patients care is consistent with the individualized Perioperative plan of care.  15.  The patients right to privacy is maintained.  16.  The patient is the recipient of competent and ethical care within legal standards of practice.  17.  The patient's value system, lifestyle, ethnicity, and culture are considered, respected, and incorporated int the perioperative plan of care and understands special services available.  18.  The patient demonstrates and/or reports adequate pain control throughout the perioperative period.  19.  The patient's neurological status is established preoperatively.  20.  The patient/caregiver demonstrates knowledge of the expected responses to the operative or invasive procedure.  21.  Patient/caregiver has reduced anxiety.  Interventions - Familiarize with environment and equipment.  22.  Patient/caregiver verbalizes understanding of Phase I and Phase II process.  23.  Patient  pain level is established preoperatively using age appropriate pain scale.

## 2015-03-26 NOTE — Progress Notes (Signed)
Pt awake and alert.  On RA.  Rates pain as 3/10.  Ok per anesthesia to transfer to phase 2.

## 2015-03-26 NOTE — Progress Notes (Signed)
Up in chair, drowsy so CPAP put on. PT wishes to rest until pain med kicks in some more. Discharge instructions reviewed with pt & husband & they verbalized understanding.

## 2015-03-26 NOTE — Progress Notes (Signed)
Pt arrived to PACU from OR.  Pt sleeping on 6l/NC.  Abd dressings x4 C/D/I.  Abd soft and round.

## 2015-03-26 NOTE — Progress Notes (Signed)
Oral airway removed.  Pt MAE to command.  Denies pain.   Pt quickly back to sleep.

## 2015-03-26 NOTE — Op Note (Signed)
Department of Obstetrics and Gynecology  Brief Operative None    Pre-operative Diagnosis: 1. AUB 2. Uterine fibroids 3. Uterine polyp  Post-operative Diagnosis: the same  Procedure: RA TLH with BL salpingectomy, cystoscopy  Anesthesia: General  Surgeon: Margaretha Seeds, MD  Assistant: Gardiner Ramus, SA  Antibiotics: Ancef  Findings: Enlarged, 13 weeks size uterus, with multiple fibroids, bilateral ureteral spills confirmed on cysto  Complications: None  Specimens: uterus, cervix, both fallopian tubes  Urine Output:   Estimated blood loss:    Electronically signed by Margaretha Seeds, MD on 03/26/2015 at 10:02 AM

## 2017-12-21 ENCOUNTER — Inpatient Hospital Stay
Admit: 2017-12-21 | Discharge: 2017-12-22 | Disposition: A | Discharge status: Home / Self Care | Payer: Legal Liability / Liability Insurance

## 2017-12-21 DIAGNOSIS — M542 Cervicalgia: Secondary | ICD-10-CM

## 2017-12-21 MED ORDER — lidocaine (LIDODERM) 5 %
5 | MEDICATED_PATCH | TOPICAL | 0 refills | 30.00000 days | Status: AC
Start: 2017-12-21 — End: ?

## 2017-12-21 MED ORDER — cyclobenzaprine (FLEXERIL) 5 MG tablet
5 | ORAL_TABLET | Freq: Two times a day (BID) | ORAL | 0 refills | Status: AC | PRN
Start: 2017-12-21 — End: 2017-12-26

## 2017-12-21 MED ORDER — lidocaine (LIDODERM) 5 % 1 patch
5 | Freq: Once | TOPICAL | Status: AC
Start: 2017-12-21 — End: 2017-12-22
  Administered 2017-12-22: 1 via TRANSDERMAL

## 2017-12-21 MED FILL — LIDODERM 5 % TOPICAL PATCH: 5 5 % | TOPICAL | Qty: 1
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
## 2017-12-21 NOTE — Unmapped (Signed)
Patient discharged from [facility] ED.  Patient verbalized understanding of instructions on following up with PCP and specialist. AVS and Prescriptions provided to patient. Opportunity provided for patient to ask questions.
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
## 2018-02-20 HISTORY — PX: COLONOSCOPY: SHX174

## 2019-04-17 ENCOUNTER — Ambulatory Visit: Payer: PRIVATE HEALTH INSURANCE | Attending: Pulmonary Disease

## 2019-11-04 NOTE — Progress Notes (Signed)
New Patient Note  RE: Ann Valenzuela MRN: [id number] DOB: 27-Feb-1965 Date of Office Visit: 11/05/2019  Referring provider: No ref. provider found Primary care provider: Lennie Odor, [place]  Chief Complaint: Allergic Rhinitis  and Food Intolerance (nuts)  History of Present Illness: I had the pleasure of seeing Bhumi Godbey for initial evaluation at the [facility] on 11/05/2019. She is a 54 y.o. female, who is self-referred here for the establishing care for environmental allergies and asthma. Patient moved from [place] to [facility] on June 1st.   Rhinitis:  She reports symptoms of dry cough, rhinorrhea, wheezing, itchy skin, sneezing, nasal congestion, itchy/watery eyes. Symptoms have been going on for 20+ years. The symptoms are present mainly in the spring and fall. Anosmia: no. Headache: no. She has used Claritin, azelastine 1-2 sprays BID, Flonase and Singulair with fair improvement in symptoms. Sinus infections: none in the past year. Previous work up includes: last skin testing was 7 years ago which showed some multiple positives per patient report. Patient was on allergy injections when she lived in [place] from 2007 to 2010 with unknown benefit. No reactions with the allergy injections.  Previous ENT evaluation: no. Previous sinus imaging: no. History of nasal polyps: no. Last eye exam: last December 2020. History of reflux: yes and takes medications for this.  Asthma:  She reports symptoms of shortness of breath, coughing, wheezing for 20+ years. Current medications include albuterol prn and albuterol/ipratropium nebulizer which help. She reports using aerochamber with inhalers. She tried the following inhalers: Flovent, Advair Diskus, Alvesco. Main triggers are exertion and infections. In the last month, frequency of symptoms: more often during the spring and fall. Frequency of nocturnal symptoms: 0x/month. Frequency of SABA use: depends. Interference with  physical activity: sometimes. In the last 12 months, emergency room visits/urgent care visits/doctor office visits or hospitalizations due to respiratory issues: 0. In the last 12 months, oral steroids courses: 0. Lifetime history of hospitalization for respiratory issues: no. Prior intubations: no. History of pneumonia: yes. She was evaluated by allergist/pulmonologist in the past. Smoking exposure: denies. Up to date with flu vaccine: yes. Up to date with pneumonia vaccine: yes. Up to date with COVID-19 vaccine: yes.   Food: She reports food allergy to nuts which started happening this year.  Pecans, walnuts cause oral dryness and throat dryness. Almonds sometimes causes the same reaction.  Cashews sometimes cause diarrhea.  These reactions only happens sometimes and not every time. She has to eat a handful of them and symptoms started within a few minutes.   Denies any hives, swelling, wheezing, abdominal pain, vomiting. Denies any associated cofactors such as exertion, infection, NSAID use, or alcohol consumption. The symptoms lasted for 1 day. She was mot evaluated in ED. She does not have access to epinephrine autoinjector and not needed to use it.   Past work up includes: None. Dietary History: patient has been eating other foods including milk, eggs, peanuts, sesame, shellfish, seafood, soy, wheat, meats, fruits and vegetables.  Assessment and Plan: Ann Valenzuela is a 54 y.o. female with: Other allergic rhinitis Patient self-referred for establishing care with allergist.  Rhinoconjunctivitis symptoms for 20+ years mainly in the spring and fall.  Currently on Singulair, azelastine nasal spray and Claritin with some benefit.  The skin testing was over 7 years ago which showed multiple positives per patient report.  Allergy injections in [place] from 2007-2010 with unknown benefit.  In process of obtaining records from [place].  Continue environmental control measures  as below.  May use over the  counter antihistamines such as Zyrtec (cetirizine), Claritin (loratadine), Allegra (fexofenadine), or Xyzal (levocetirizine) daily as needed.  Continue with montelukast (Singulair) 10mg  daily.   May use azelastine nasal spray 1-2 sprays per nostril twice a day as needed for runny nose/drainage.  May use Flonase (fluticasone) nasal spray 1 spray per nostril twice a day as needed for nasal congestion.   Nasal saline spray (i.e., Simply Saline) or nasal saline lavage (i.e., NeilMed) is recommended as needed and prior to medicated nasal sprays.  Monitor symptoms - if they get worse will retest later as patient just moved to [place] in June 2021.  Allergic conjunctivitis of both eyes  See assessment and plan as above for allergic rhinitis.  Uncomplicated asthma Diagnosed with asthma over 20 years ago.  Currently has albuterol HFA and nebulizer as needed.  Used to be on Flovent, Advair Diskus and Alvesco.  Main triggers are exertion and infections.  No recent prednisone use. . Today's spirometry was normal. . Daily controller medication(s): none. . During upper respiratory infections/asthma flares: start Alvesco 195mcg 2 puffs twice a day for 1-2 weeks until your breathing symptoms return to baseline.  o Sample given.  . May use albuterol rescue inhaler 2 puffs or nebulizer every 4 to 6 hours as needed for shortness of breath, chest tightness, coughing, and wheezing. May use albuterol rescue inhaler 2 puffs 5 to 15 minutes prior to strenuous physical activities. Monitor frequency of use.   Adverse food reaction Most recently patient is concerned about tree nut allergies.  She noted some oral/throat dryness after pecan, walnut and almond ingestion.  Sometimes cashews cause diarrhea.  However this does not occur upon each exposure.  Today' skin prick testing was negative to tree nuts.  Monitor symptoms while eating tree nuts but no need to avoid at this time.   Discussed with patient  that her clinical history does not support an IgE mediated adverse food reaction as they are not occurring reproducibly each time.  Most likely the dryness of the tree nuts is causing the symptoms.  Return in about 4 months (around 03/06/2020).  Meds ordered this encounter  Medications  . montelukast (SINGULAIR) 10 MG tablet    Sig: Take 1 tablet (10 mg total) by mouth at bedtime.    Dispense:  90 tablet    Refill:  1   Lab Orders  No laboratory test(s) ordered today    Other allergy screening: Medication allergy: no Hymenoptera allergy: no Urticaria: no Eczema:no History of recurrent infections suggestive of immunodeficency: no  Diagnostics: Spirometry:  Tracings reviewed. Her effort: Good reproducible efforts. FVC: 2.67L FEV1: 2.05L, 78% predicted FEV1/FVC ratio: 77% Interpretation: Spirometry consistent with normal pattern.  Please see scanned spirometry results for details.  Skin Testing: Select foods. Negative test to: tree nuts.  Results discussed with patient/family.  Food Adult Perc - 11/05/19 1400    Time Antigen Placed 1458    Allergen Manufacturer Lavella Hammock    Location Back     Control-buffer 50% Glycerol Negative    Control-Histamine 1 mg/ml 2+    10. Cashew Negative    11. Pecan Food Negative    12. Con[place] Negative    13. Almond Negative    14. Hazelnut Negative    15. [place] nut Negative    16. Coconut Negative    17. Pistachio Negative           Past Medical History: Patient Active Problem List  Diagnosis Date Noted  . Other allergic rhinitis 11/05/2019  . Allergic conjunctivitis of both eyes 11/05/2019  . Uncomplicated asthma 13/09/6576  . Adverse food reaction 11/05/2019   Past Medical History:  Diagnosis Date  . Asthma   . Hypertension    Past Surgical History: Past Surgical History:  Procedure Laterality Date  . ABDOMINAL HYSTERECTOMY     Medication List:  Current Outpatient Medications  Medication Sig Dispense Refill  .  albuterol (VENTOLIN HFA) 108 (90 Base) MCG/ACT inhaler Inhale into the lungs every 6 (six) hours as needed for wheezing or shortness of breath.    Marland Kitchen atorvastatin (LIPITOR) 10 MG tablet Take 10 mg by mouth daily.    . famotidine (PEPCID) 20 MG tablet Take 20 mg by mouth 2 (two) times daily.    . fenofibrate (TRICOR) 145 MG tablet Take 145 mg by mouth daily.    . fluticasone (FLONASE) 50 MCG/ACT nasal spray Place into both nostrils daily.    Marland Kitchen loratadine (CLARITIN) 10 MG tablet Take 10 mg by mouth daily.    Marland Kitchen losartan (COZAAR) 25 MG tablet Take 25 mg by mouth daily.    Marland Kitchen omeprazole (PRILOSEC) 40 MG capsule Take 40 mg by mouth daily.    . montelukast (SINGULAIR) 10 MG tablet Take 1 tablet (10 mg total) by mouth at bedtime. 90 tablet 1   No current facility-administered medications for this visit.   Allergies: No Known Allergies Social History: Social History   Socioeconomic History  . Marital status: Not on file    Spouse name: Not on file  . Number of children: Not on file  . Years of education: Not on file  . Highest education level: Not on file  Occupational History  . Not on file  Tobacco Use  . Smoking status: Never Smoker  . Smokeless tobacco: Never Used  Substance and Sexual Activity  . Alcohol use: Yes  . Drug use: Not on file  . Sexual activity: Not on file  Other Topics Concern  . Not on file  Social History Narrative  . Not on file   Social Determinants of Health   Financial Resource Strain:   . Difficulty of Paying Living Expenses: Not on file  Food Insecurity:   . Worried About Charity fundraiser in the Last Year: Not on file  . Ran Out of Food in the Last Year: Not on file  Transportation Needs:   . Lack of Transportation (Medical): Not on file  . Lack of Transportation (Non-Medical): Not on file  Physical Activity:   . Days of Exercise per Week: Not on file  . Minutes of Exercise per Session: Not on file  Stress:   . Feeling of Stress : Not on file   Social Connections:   . Frequency of Communication with Friends and Family: Not on file  . Frequency of Social Gatherings with Friends and Family: Not on file  . Attends Religious Services: Not on file  . Active Member of Clubs or Organizations: Not on file  . Attends Archivist Meetings: Not on file  . Marital Status: Not on file   Lives in a house. Smoking: denies Occupation: Futures trader HistoryFreight forwarder in the house: no Charity fundraiser in the family room: no Carpet in the bedroom: yes Heating: gas Cooling: central Pet: yes 1 dog  x 11 yrs  Family History: Family History  Problem Relation Age of Onset  . Asthma Mother   . Allergic rhinitis  Father   . Allergic rhinitis Sister   . Asthma Sister    Review of Systems  Constitutional: Negative for appetite change, chills, fever and unexpected weight change.  HENT: Negative for congestion and rhinorrhea.   Eyes: Positive for itching.  Respiratory: Positive for cough. Negative for chest tightness, shortness of breath and wheezing.   Cardiovascular: Negative for chest pain.  Gastrointestinal: Negative for abdominal pain.  Genitourinary: Negative for difficulty urinating.  Skin: Negative for rash.  Allergic/Immunologic: Positive for environmental allergies.  Neurological: Negative for headaches.   Objective: BP 124/82   Pulse 100   Temp 98.6 F (37 C) (Temporal)   Resp 16   Ht 5\' 3"  (1.6 m)   Wt 172 lb 12.8 oz (78.4 kg)   SpO2 98%   BMI 30.61 kg/m  Body mass index is 30.61 kg/m. Physical Exam Vitals and nursing note reviewed.  Constitutional:      Appearance: Normal appearance. She is well-developed.  HENT:     Head: Normocephalic and atraumatic.     Right Ear: External ear normal.     Left Ear: External ear normal.     Nose: Nose normal.     Mouth/Throat:     Mouth: Mucous membranes are moist.     Pharynx: Oropharynx is clear.  Eyes:     Conjunctiva/sclera: Conjunctivae normal.   Cardiovascular:     Rate and Rhythm: Normal rate and regular rhythm.     Heart sounds: Normal heart sounds. No murmur heard.  No friction rub. No gallop.   Pulmonary:     Effort: Pulmonary effort is normal.     Breath sounds: Normal breath sounds. No wheezing, rhonchi or rales.  Abdominal:     Palpations: Abdomen is soft.  Musculoskeletal:     Cervical back: Neck supple.  Skin:    General: Skin is warm.     Findings: No rash.  Neurological:     Mental Status: She is alert and oriented to person, place, and time.  Psychiatric:        Behavior: Behavior normal.    The plan was reviewed with the patient/family, and all questions/concerned were addressed.  It was my pleasure to see Leiah today and participate in her care. Please feel free to contact me with any questions or concerns.  Sincerely,  Rexene Alberts, DO Allergy & Immunology  [facility] office: [phone] [facility] office: [place] office: [phone]

## 2019-11-05 ENCOUNTER — Ambulatory Visit: Payer: 59 | Admitting: Allergy

## 2019-11-05 ENCOUNTER — Other Ambulatory Visit: Payer: Self-pay

## 2019-11-05 ENCOUNTER — Encounter: Payer: Self-pay | Admitting: Allergy

## 2019-11-05 VITALS — BP 124/82 | HR 100 | Temp 98.6°F | Resp 16 | Ht 63.0 in | Wt 172.8 lb

## 2019-11-05 DIAGNOSIS — J3089 Other allergic rhinitis: Secondary | ICD-10-CM | POA: Diagnosis not present

## 2019-11-05 DIAGNOSIS — H1013 Acute atopic conjunctivitis, bilateral: Secondary | ICD-10-CM | POA: Diagnosis not present

## 2019-11-05 DIAGNOSIS — T781XXD Other adverse food reactions, not elsewhere classified, subsequent encounter: Secondary | ICD-10-CM

## 2019-11-05 DIAGNOSIS — J45909 Unspecified asthma, uncomplicated: Secondary | ICD-10-CM

## 2019-11-05 DIAGNOSIS — T781XXA Other adverse food reactions, not elsewhere classified, initial encounter: Secondary | ICD-10-CM | POA: Insufficient documentation

## 2019-11-05 DIAGNOSIS — T7819XA Other adverse food reactions, not elsewhere classified, initial encounter: Secondary | ICD-10-CM | POA: Insufficient documentation

## 2019-11-05 HISTORY — DX: Unspecified asthma, uncomplicated: J45.909

## 2019-11-05 MED ORDER — MONTELUKAST SODIUM 10 MG PO TABS: 10.0000 mg | ORAL_TABLET | Freq: Every day | ORAL | 1 refills | Status: DC

## 2019-11-05 NOTE — Assessment & Plan Note (Signed)
Most recently patient is concerned about tree nut allergies.  She noted some oral/throat dryness after pecan, walnut and almond ingestion.  Sometimes cashews cause diarrhea.  However this does not occur upon each exposure.  Today' skin prick testing was negative to tree nuts.  Monitor symptoms while eating tree nuts but no need to avoid at this time.   Discussed with patient that her clinical history does not support an IgE mediated adverse food reaction as they are not occurring reproducibly each time.  Most likely the dryness of the tree nuts is causing the symptoms.

## 2019-11-05 NOTE — Assessment & Plan Note (Signed)
   See assessment and plan as above for allergic rhinitis.  

## 2019-11-05 NOTE — Assessment & Plan Note (Signed)
Diagnosed with asthma over 20 years ago.  Currently has albuterol HFA and nebulizer as needed.  Used to be on Flovent, Advair Diskus and Alvesco.  Main triggers are exertion and infections.  No recent prednisone use. . Today's spirometry was normal. . Daily controller medication(s): none. . During upper respiratory infections/asthma flares: start Alvesco 132mcg 2 puffs twice a day for 1-2 weeks until your breathing symptoms return to baseline.  o Sample given.  . May use albuterol rescue inhaler 2 puffs or nebulizer every 4 to 6 hours as needed for shortness of breath, chest tightness, coughing, and wheezing. May use albuterol rescue inhaler 2 puffs 5 to 15 minutes prior to strenuous physical activities. Monitor frequency of use.

## 2019-11-05 NOTE — Assessment & Plan Note (Signed)
Patient self-referred for establishing care with allergist.  Rhinoconjunctivitis symptoms for 20+ years mainly in the spring and fall.  Currently on Singulair, azelastine nasal spray and Claritin with some benefit.  The skin testing was over 7 years ago which showed multiple positives per patient report.  Allergy injections in [place] from 2007-2010 with unknown benefit.  In process of obtaining records from [place].  Continue environmental control measures as below.  May use over the counter antihistamines such as Zyrtec (cetirizine), Claritin (loratadine), Allegra (fexofenadine), or Xyzal (levocetirizine) daily as needed.  Continue with montelukast (Singulair) 10mg  daily.   May use azelastine nasal spray 1-2 sprays per nostril twice a day as needed for runny nose/drainage.  May use Flonase (fluticasone) nasal spray 1 spray per nostril twice a day as needed for nasal congestion.   Nasal saline spray (i.e., Simply Saline) or nasal saline lavage (i.e., NeilMed) is recommended as needed and prior to medicated nasal sprays.  Monitor symptoms - if they get worse will retest later as patient just moved to [place] in June 2021.

## 2019-11-05 NOTE — Patient Instructions (Addendum)
I will review your previous allergist's records.  Food:   Today's skin testing showed: Negative to tree nuts.  Monitor symptoms while eating tree nuts but no need to avoid at this time.   Most likely not an allergic reaction.  Environmental allergies  Continue environmental control measures as below.  May use over the counter antihistamines such as Zyrtec (cetirizine), Claritin (loratadine), Allegra (fexofenadine), or Xyzal (levocetirizine) daily as needed.  Continue with montelukast (singulair) 10mg  daily.   May use azelastine nasal spray 1-2 sprays per nostril twice a day as needed for runny nose/drainage.  May use Flonase (fluticasone) nasal spray 1 spray per nostril twice a day as needed for nasal congestion.   Nasal saline spray (i.e., Simply Saline) or nasal saline lavage (i.e., NeilMed) is recommended as needed and prior to medicated nasal sprays.  Monitor symptoms - if they get worse will retest later.   Asthma: . Daily controller medication(s): none. . During upper respiratory infections/asthma flares: start Alvesco 182mcg 2 puffs twice a day for 1-2 weeks until your breathing symptoms return to baseline.  o Sample given.  . May use albuterol rescue inhaler 2 puffs or nebulizer every 4 to 6 hours as needed for shortness of breath, chest tightness, coughing, and wheezing. May use albuterol rescue inhaler 2 puffs 5 to 15 minutes prior to strenuous physical activities. Monitor frequency of use.  . Asthma control goals:  o Full participation in all desired activities (may need albuterol before activity) o Albuterol use two times or less a week on average (not counting use with activity) o Cough interfering with sleep two times or less a month o Oral steroids no more than once a year o No hospitalizations  Follow up in 4 months or sooner if needed.   Reducing Pollen Exposure . Pollen seasons: trees (spring), grass (summer) and ragweed/weeds (fall). Marland Kitchen Keep windows closed  in your home and car to lower pollen exposure.  Susa Simmonds air conditioning in the bedroom and throughout the house if possible.  . Avoid going out in dry windy days - especially early morning. . Pollen counts are highest between 5 - 10 AM and on dry, hot and windy days.  . Save outside activities for late afternoon or after a heavy rain, when pollen levels are lower.  . Avoid mowing of grass if you have grass pollen allergy. Marland Kitchen Be aware that pollen can also be transported indoors on people and pets.  . Dry your clothes in an automatic dryer rather than hanging them outside where they might collect pollen.  . Rinse hair and eyes before bedtime.

## 2019-11-10 ENCOUNTER — Encounter: Payer: Self-pay | Admitting: Allergy

## 2019-11-10 ENCOUNTER — Ambulatory Visit: Payer: Self-pay | Admitting: Allergy

## 2019-11-10 NOTE — Progress Notes (Signed)
Received records from previous allergist Dr. Jeral Pinch. Last OV 06/24/2019. IDs positive in 2014 by another allergist. On IT for 5 years in the past.  Scanned notes.

## 2019-12-12 ENCOUNTER — Telehealth: Payer: Self-pay | Admitting: Genetic Counselor

## 2019-12-12 NOTE — Telephone Encounter (Signed)
Received a genetic counseling referral from Dr. Marlou Starks for fhx of breast, colon and ovarian cancers. Ann Valenzuela has been cld and scheduled to see Cari on 11/11 at 2pm. Pt aware to arrive 15 minutes early. Letter mailed.

## 2019-12-15 ENCOUNTER — Other Ambulatory Visit: Payer: Self-pay | Admitting: General Surgery

## 2019-12-15 DIAGNOSIS — N6001 Solitary cyst of right breast: Secondary | ICD-10-CM

## 2020-01-01 ENCOUNTER — Other Ambulatory Visit: Payer: Self-pay

## 2020-01-01 ENCOUNTER — Inpatient Hospital Stay: Payer: 59 | Attending: Genetic Counselor | Admitting: Genetic Counselor

## 2020-01-01 ENCOUNTER — Inpatient Hospital Stay: Payer: 59

## 2020-01-01 ENCOUNTER — Other Ambulatory Visit: Payer: Self-pay | Admitting: Genetic Counselor

## 2020-01-01 DIAGNOSIS — Z803 Family history of malignant neoplasm of breast: Secondary | ICD-10-CM

## 2020-01-01 DIAGNOSIS — Z800 Family history of malignant neoplasm of digestive organs: Secondary | ICD-10-CM | POA: Diagnosis not present

## 2020-01-01 DIAGNOSIS — Z8041 Family history of malignant neoplasm of ovary: Secondary | ICD-10-CM

## 2020-01-01 LAB — GENETIC SCREENING ORDER

## 2020-01-02 ENCOUNTER — Encounter: Payer: Self-pay | Admitting: Genetic Counselor

## 2020-01-02 NOTE — Progress Notes (Signed)
EFERRING PROVIDER: Jovita Kussmaul, MD [address],  [place] [address]  PRIMARY PROVIDER:  Lennie Odor, PA  PRIMARY REASON FOR VISIT:  1. Family history of ovarian cancer   2. Family history of breast cancer    HISTORY OF PRESENT ILLNESS:   Ann Valenzuela, a 54 y.o. female, was seen for a [place] cancer genetics consultation at the request of Dr. Marlou Starks due to a family history of breast, ovarian, and colon cancers.  Ann Valenzuela presents to clinic today to discuss the possibility of a hereditary predisposition to cancer, to discuss genetic testing, and to further clarify her future cancer risks, as well as potential cancer risks for family members.   Ann Valenzuela is a 54 y.o. female with no personal history of cancer.  She has a personal history of breast masses in the upper-outer quadrant of her right breast for which she has been seen by Dr. Marlou Starks.  She is scheduled to have a follow-up mammogram in December with aspirations of possible cysts.   RISK FACTORS:  Menarche was at age 43.  First live birth at age 41.  OCP use for approximately 12-15 years.  Ovaries intact: yes.  Hysterectomy: yes in 2017 due to abnormal bleeding HRT use: 0 years. Colonoscopy: yes; most recent in 01/2019 per patient; return in 10 years. Mammogram within the last year: yes. Number of breast biopsies: 1 in 2015; benign per patient  Up to date with pelvic exams: yes. Any excessive radiation exposure in the past: no  Past Medical History:  Diagnosis Date  . Asthma   . Hypertension     Past Surgical History:  Procedure Laterality Date  . ABDOMINAL HYSTERECTOMY      Social History   Socioeconomic History  . Marital status: Married    Spouse name: Not on file  . Number of children: Not on file  . Years of education: Not on file  . Highest education level: Not on file  Occupational History  . Not on file  Tobacco Use  . Smoking status: Never Smoker  . Smokeless tobacco:  Never Used  Substance and Sexual Activity  . Alcohol use: Yes (~ 6-12 drinks per year)  . Drug use: Not on file  . Sexual activity: Not on file  Other Topics Concern  . Not on file  Social History Narrative  . Not on file    FAMILY HISTORY:  We obtained a detailed, 4-generation family history.  Significant diagnoses are listed below: Family History  Problem Relation Age of Onset  . Breast cancer Maternal Aunt        dx after 50  . Colon cancer Other        MGF's sister; dx late 7s  . Ovarian cancer Maternal Aunt 69  . Leukemia Cousin 25       maternal cousin  . Colon cancer Cousin 18       maternal cousin; metastatic      Ann Valenzuela has two sons and one daughter, all without a history of cancer.  Ann Valenzuela two sisters do not have a history of cancer.  Ann Valenzuela mother is 1 years old and had a TAH/BSO in her late 63s.  Ann Valenzuela maternal aunt had ovarian cancer diagnosed at 29 and passed away at 19.  Ann Valenzuela other maternal aunt was diagnosed with breast cancer after the age of 44 and is living at age 49.  Ann Valenzuela also has a maternal cousin  diagnosed with leukemia at 17 and a maternal cousin diagnosed with metastatic colon cancer at 56.  Ann Valenzuela maternal grandfather's sister had a history of colon cancer in her late 77s. No other maternal family history of cancer was reported.  Ann Valenzuela father is 80 years old.  Ann Valenzuela paternal aunt has a possible history of cancer.  No other paternal family history of cancer was reported.   Ann Valenzuela is unaware of previous family history of genetic testing for hereditary cancer risks. Patient's maternal ancestors are of [place] and [place] descent, and paternal ancestors are of [place] and [place] descent. There is no reported Ashkenazi Jewish ancestry. There is no known consanguinity.  GENETIC COUNSELING ASSESSMENT: Ann Valenzuela is a 54 y.o. female with a family history of breast,  ovarian, and colon cancer which is somewhat suggestive of a hereditary cancer sndrome and predisposition to cancer given the related cancers in the family as well as ages of diagnosis. We, therefore, discussed and recommended the following at today's visit.   DISCUSSION: We discussed that 5 - 10% of cancer is hereditary, with most cases of hereditary breast and ovarian cancer associated with mutations in BRCA1/2.  There are other genes that can be associated with hereditary breast, ovarian, or colon cancer syndromes.  Type of cancer risk and level of risk are gene-specific.  We discussed that testing is beneficial for several reasons, including knowing about other cancer risks, identifying potential screening and risk-reduction options that may be appropriate, and  understanding if other family members could be at risk for cancer and allowing them to undergo genetic testing.  We reviewed the characteristics, features and inheritance patterns of hereditary cancer syndromes. We also discussed genetic testing, including the appropriate family members to test, the process of testing, insurance coverage and turn-around-time for results. We discussed the implications of a negative, positive, carrier and/or variant of uncertain significant result. We discussed that negative results would be uninformative given that Ann Valenzuela does not have a personal history of cancer. We recommended Ann Valenzuela pursue genetic testing for a panel that contains genes associated with breast, ovarian, and colon cancer.  Ann Valenzuela was offered a common hereditary cancer panel (48 genes) and an expanded pan-cancer panel (85 genes). Ann Valenzuela was informed of the benefits and limitations of each panel, including that expanded pan-cancer panels contain several preliminary evidence genes that do not have clear management guidelines at this point in time.  We also discussed that as the number of genes included on a panel  increases, the chances of variants of uncertain significance increases.  After considering the benefits and limitations of each gene panel, Ann Valenzuela. Damiano elected to have an common hereditary cancer panel through [facility].  The Common Hereditary Cancers Panel offered by [facility] includes sequencing and/or deletion duplication testing of the following 48 genes: APC, ATM, AXIN2, BARD1, BMPR1A, BRCA1, BRCA2, BRIP1, CDH1, CDK4, CDKN2A (p14ARF), CDKN2A (p16INK4a), CHEK2, CTNNA1, DICER1, EPCAM (Deletion/duplication testing only), GREM1 (promoter region deletion/duplication testing only), KIT, MEN1, MLH1, MSH2, MSH3, MSH6, MUTYH, NBN, NF1, NHTL1, PALB2, PDGFRA, PMS2, POLD1, POLE, PTEN, RAD50, RAD51C, RAD51D, RNF43, SDHB, SDHC, SDHD, SMAD4, SMARCA4. STK11, TP53, TSC1, TSC2, and VHL.  The following genes were evaluated for sequence changes only: SDHA and HOXB13 c.251G>A variant only.   Based on Ann Valenzuela. Schiele's family history of cancer, she meets medical criteria for genetic testing. Despite that she meets criteria, she may still have an out of pocket cost. We discussed that if her out of pocket cost  for testing is over $100, the laboratory will call and confirm whether she wants to proceed with testing.  If the out of pocket cost of testing is less than $100 she will be billed by the genetic testing laboratory.   We discussed that some people do not want to undergo genetic testing due to fear of genetic discrimination.  A federal law called the Genetic Information Non-Discrimination Act (GINA) of 2008 helps protect individuals against genetic discrimination based on their genetic test results.  It impacts both health insurance and employment.  With health insurance, it protects against increased premiums, being kicked off insurance or being forced to take a test in order to be insured.  For employment it protects against hiring, firing and promoting decisions based on genetic test results.  GINA does not apply to those  in the [employer], those who work for companies with less than 15 employees, and new life insurance or long-term disability insurance policies.  Health status due to a cancer diagnosis is not protected under GINA.  PLAN: After considering the risks, benefits, and limitations, Ann Valenzuela. Degrave provided informed consent to pursue genetic testing and the blood sample was sent to [facility] for analysis of the Common Hereditary Cancer Panel. Results should be available within approximately 3 weeks' time, at which point they will be disclosed by telephone to Ann Valenzuela. Darnold, as will any additional recommendations warranted by these results. Ann Valenzuela. Laroche will receive a summary of her genetic counseling visit and a copy of her results once available. This information will also be available in Epic.   Lastly, we encouraged Ann Valenzuela. Littleton to remain in contact with cancer genetics annually so that we can continuously update the family history and inform her of any changes in cancer genetics and testing that may be of benefit for this family.   Ann Valenzuela. Mcglade questions were answered to her satisfaction today. Our contact information was provided should additional questions or concerns arise. Thank you for the referral and allowing [place] to share in the care of your patient.   Luanna Weesner M. Joette Catching, [place], [facility] Certified Film/video [email or web address] (P) [phone]   The patient was seen for a total of 40 minutes in face-to-face genetic counseling.  This patient was discussed with Drs. Magrinat, Lindi Adie and/or Burr Medico who agrees with the above.    _______________________________________________________________________ For Office Staff:  Number of people involved in session: 1 Was an Intern/ student involved with case: no

## 2020-01-08 ENCOUNTER — Encounter: Payer: Self-pay | Admitting: Genetic Counselor

## 2020-01-12 ENCOUNTER — Encounter: Payer: Self-pay | Admitting: Genetic Counselor

## 2020-01-12 ENCOUNTER — Telehealth: Payer: Self-pay | Admitting: Genetic Counselor

## 2020-01-12 ENCOUNTER — Ambulatory Visit: Payer: Self-pay | Admitting: Genetic Counselor

## 2020-01-12 DIAGNOSIS — Z8041 Family history of malignant neoplasm of ovary: Secondary | ICD-10-CM

## 2020-01-12 DIAGNOSIS — Z803 Family history of malignant neoplasm of breast: Secondary | ICD-10-CM

## 2020-01-12 DIAGNOSIS — Z1379 Encounter for other screening for genetic and chromosomal anomalies: Secondary | ICD-10-CM | POA: Insufficient documentation

## 2020-01-12 DIAGNOSIS — Z800 Family history of malignant neoplasm of digestive organs: Secondary | ICD-10-CM

## 2020-01-12 NOTE — Telephone Encounter (Addendum)
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  Discussed that we do not know why there is cancer in the family. It could be sporadic, due a gene that she did not inherit, due to a different gene that we are not testing, or maybe our current technology may not be able to pick something up.  It will be important for her to keep in contact with genetics to keep up with whether additional testing may be needed.

## 2020-01-12 NOTE — Progress Notes (Signed)
HPI:  Ann Valenzuela was previously seen in the [place] clinic due to a family history of cancer and concerns regarding a hereditary predisposition to cancer. Please refer to our prior cancer genetics clinic note for more information regarding our discussion, assessment and recommendations, at the time. Ann Valenzuela recent genetic test results were disclosed to her, as were recommendations warranted by these results. These results and recommendations are discussed in more detail below.  CANCER HISTORY:  Ann Valenzuela is a 54 y.o. female with no personal history of cancer.  She has a personal history of breast masses in the upper-outer quadrant of her right breast for which she has been seen by Dr. Marlou Starks.  She is scheduled to have a follow-up mammogram in December with aspirations of possible cysts.   FAMILY HISTORY:  We obtained a detailed, 4-generation family history.  Significant diagnoses are listed below: Family History  Problem Relation Age of Onset  . Breast cancer Maternal Aunt        dx after 50  . Colon cancer Other        MGF's sister; dx late 37s  . Ovarian cancer Maternal Aunt 69  . Leukemia Cousin 25       maternal cousin  . Colon cancer Cousin 58       maternal cousin; metastatic   . Pancreatic cancer Paternal Aunt   . Bone cancer Other        distant paternal cousin     Ann Valenzuela has two sons and one daughter, all without a history of cancer.  Ann Valenzuela two sisters do not have a history of cancer.  Ann Valenzuela mother is 107 years old and had a TAH/BSO in her late 40s.  Ann Valenzuela maternal aunt had ovarian cancer diagnosed at 56 and passed away at 32.  Ann Valenzuela other maternal aunt was diagnosed with breast cancer after the age of 12 and is living at age 62.  Ann Valenzuela also has a maternal cousin diagnosed with leukemia at 81 and a maternal cousin diagnosed with metastatic colon cancer at 34.  Ann Valenzuela maternal  grandfather's sister had a history of colon cancer in her late 46s. No other maternal family history of cancer was reported.  Ann Valenzuela father is 24 years old.  Ann Valenzuela paternal aunt had pancreatic cancer and passed away at age 65.  No other paternal family history of cancer was reported.   Ann Valenzuela is unaware of previous family history of genetic testing for hereditary cancer risks. Patient's maternal ancestors are of [place] and [place] descent, and paternal ancestors are of [place] and [place] descent. There is no reported Ashkenazi Jewish ancestry. There is no known consanguinity.  GENETIC TEST RESULTS: Genetic testing reported out on January 09, 2020.  The [facility] Common Hereditary Cancers Panel found no pathogenic mutations. The Common Hereditary Cancers Panel offered by [facility] includes sequencing and/or deletion duplication testing of the following 48 genes: APC, ATM, AXIN2, BARD1, BMPR1A, BRCA1, BRCA2, BRIP1, CDH1, CDK4, CDKN2A (p14ARF), CDKN2A (p16INK4a), CHEK2, CTNNA1, DICER1, EPCAM (Deletion/duplication testing only), GREM1 (promoter region deletion/duplication testing only), KIT, MEN1, MLH1, MSH2, MSH3, MSH6, MUTYH, NBN, NF1, NHTL1, PALB2, PDGFRA, PMS2, POLD1, POLE, PTEN, RAD50, RAD51C, RAD51D, RNF43, SDHB, SDHC, SDHD, SMAD4, SMARCA4. STK11, TP53, TSC1, TSC2, and VHL.  The following genes were evaluated for sequence changes only: SDHA and HOXB13 c.251G>A variant only.  The test report has been scanned into EPIC and is located under the Molecular Pathology section of  the Results Review tab.  A portion of the result report is included below for reference.     We discussed with Ann Valenzuela that because current genetic testing is not perfect, it is possible there may be a gene mutation in one of these genes that current testing cannot detect, but that chance is small.  We also discussed, that there could be another gene that has not yet been discovered, or that we have not  yet tested, that is responsible for the cancer diagnoses in the family. It is also possible there is a hereditary cause for the cancer in the family that Ann Valenzuela did not inherit and therefore was not identified in her testing.  Therefore, it is important to remain in touch with cancer genetics in the future so that we can continue to offer Ann Valenzuela the most up to date genetic testing.   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  At this time, it is unknown if this variant is associated with increased cancer risk or if this is a normal finding, but most variants such as this get reclassified to being inconsequential. It should not be used to make medical management decisions. With time, we suspect the lab will determine the significance of this variant, if any. If we do learn more about it, we will try to contact Ann Valenzuela to discuss it further. However, it is important to stay in touch with [place] periodically and keep the address and phone number up to date.  ADDITIONAL GENETIC TESTING: We discussed with Ann Valenzuela that there are other genes that are associated with increased cancer risk that can be analyzed. Should Ann Valenzuela wish to pursue additional genetic testing, we are happy to discuss and coordinate this testing, at any time.     CANCER SCREENING RECOMMENDATIONS: Ms. Cadmus test result is considered negative (normal).  This means that we have not identified a hereditary cause for her family history of cancer at this time. Most cancers happen by chance and this negative test suggests that her family's cancer may fall into this category.    While reassuring, this does not definitively rule out a hereditary predisposition to cancer. It is still possible that there could be genetic mutations that are undetectable by current technology. There could be genetic mutations in genes that  have not been tested or identified to increase cancer risk.  Therefore, it is recommended she continue to follow the cancer management and screening guidelines provided by her  primary healthcare provider.   An individual's cancer risk and medical management are not determined by genetic test results alone. Overall cancer risk assessment incorporates additional factors, including personal medical history, family history, and any available genetic information that may result in a personalized plan for cancer prevention and surveillance  RECOMMENDATIONS FOR FAMILY MEMBERS:  Individuals in this family might be at some increased risk of developing cancer, over the general population risk, simply due to the family history of cancer.  We recommended women in this family have a yearly mammogram beginning at age 6, or 59 years younger than the earliest onset of cancer, an annual clinical breast exam, and perform monthly breast self-exams. Women in this family should also have a gynecological exam as recommended by their primary provider. All family members should be referred for colonoscopy starting at age 36 or earlier, as recommended by a physician.  It is also possible there is a  hereditary cause for the cancer in Ms. Whitenack's family that she did not inherit and therefore was not identified in her.  Based on Ms. Leinberger's family history, we recommended first degree relatives of those with ovarian and pancreatic cancer in the family, including her parents, have genetic counseling and testing. Ms. Marlette will let us know if we can be of any assistance in coordinating genetic counseling and/or testing for these family member.   FOLLOW-UP: Lastly, we discussed with Ms. Funaro that cancer genetics is a rapidly advancing field and it is possible that new genetic tests will be appropriate for her and/or her family members in the future. We encouraged her to remain in contact with cancer genetics on an  annual basis so we can update her personal and family histories and let her know of advances in cancer genetics that may benefit this family.   Our contact number was provided. Ms. Obando questions were answered to her satisfaction, and she knows she is welcome to call us at anytime with additional questions or concerns.   Aldrick Derrig M. Joette Catching, [place], [facility] Certified Film/video [email or web address] (P) [phone]

## 2020-01-21 ENCOUNTER — Ambulatory Visit
Admission: RE | Admit: 2020-01-21 | Discharge: 2020-01-21 | Disposition: A | Discharge status: Home / Self Care | Payer: 59 | Source: Ambulatory Visit | Attending: General Surgery | Admitting: General Surgery

## 2020-01-21 ENCOUNTER — Other Ambulatory Visit: Payer: Self-pay | Admitting: General Surgery

## 2020-01-21 ENCOUNTER — Other Ambulatory Visit: Payer: Self-pay

## 2020-01-21 DIAGNOSIS — N6489 Other specified disorders of breast: Secondary | ICD-10-CM

## 2020-01-21 DIAGNOSIS — N6001 Solitary cyst of right breast: Secondary | ICD-10-CM

## 2020-02-02 ENCOUNTER — Ambulatory Visit
Admission: RE | Admit: 2020-02-02 | Discharge: 2020-02-02 | Disposition: A | Discharge status: Home / Self Care | Payer: 59 | Source: Ambulatory Visit | Attending: General Surgery | Admitting: General Surgery

## 2020-02-02 ENCOUNTER — Other Ambulatory Visit: Payer: Self-pay

## 2020-02-02 DIAGNOSIS — N6001 Solitary cyst of right breast: Secondary | ICD-10-CM

## 2020-03-08 ENCOUNTER — Ambulatory Visit: Payer: 59 | Admitting: Allergy

## 2020-03-16 NOTE — Progress Notes (Signed)
Follow Up Note  RE: Ann Valenzuela MRN: [id number] DOB: 1965-09-23 Date of Office Visit: 03/17/2020  Referring provider: Lennie Odor, PA Primary care provider: Lennie Odor, PA  Chief Complaint: Allergic Rhinitis   History of Present Illness: I had the pleasure of seeing Ann Valenzuela for a follow up visit at the [facility] on 03/17/2020. She is a 55 y.o. female, who is being followed for allergic rhinoconjunctivitis, asthma and adverse food reaction. Her previous allergy office visit was on 11/05/2019 with Dr. Maudie Mercury. Today is a regular follow up visit.  Allergic rhino conjunctivitis Patient wears a CPAP at night but the last few nights had some rhinorrhea and she thinks it may have been due to the increased heat and dry air indoors.  Currently on Claritin and Singulair daily with good benefit.  Only takes Flonase and azelastine as needed during the pollen season.  Asthma: Denies any SOB, coughing, wheezing, chest tightness, nocturnal awakenings, ER/urgent care visits or prednisone use since the last visit. No albuterol use and did not have to use Alvesco during URIs.  Adverse food reaction No issues with eating almonds, cashew, pecans, walnuts, macadamia nuts.   Patient had some breast biopsies done.   Assessment and Plan: Ann Valenzuela is a 55 y.o. female with: Mild intermittent asthma Doing well with no inhaler use.  Today's ACT score 25.  Today's spirometry was normal. . Daily controller medication(s): none. . During upper respiratory infections/asthma flares: start Alvesco 135mcg 2 puffs twice a day for 1-2 weeks until your breathing symptoms return to baseline.  . May use albuterol rescue inhaler 2 puffs every 4 to 6 hours as needed for shortness of breath, chest tightness, coughing, and wheezing. May use albuterol rescue inhaler 2 puffs 5 to 15 minutes prior to strenuous physical activities. Monitor frequency of use.   Other allergic  rhinitis Past history - Rhinoconjunctivitis symptoms for 20+ years mainly in the spring and fall.  Currently on Singulair, azelastine nasal spray and Claritin with some benefit.  Last skin testing was over 7 years ago which showed multiple positives per patient report.  Allergy injections in [place] from 2007-2010 with unknown benefit. Records scanned.  Interim history - increased rhinorrhea the last few days due to heat/dry air? Wears CPAP at night.   Continue environmental control measures as below.  May use over the counter antihistamines such as Zyrtec (cetirizine), Claritin (loratadine), Allegra (fexofenadine), or Xyzal (levocetirizine) daily as needed.  Continue with montelukast (Singulair) 10mg  daily.   Try to use a humidifier in the home during the winter months.  Try saline spray/gel for the nose.  If no improvement then:   May use azelastine nasal spray 1-2 sprays per nostril twice a day as needed for runny nose/drainage.  May use Flonase (fluticasone) nasal spray 1 spray per nostril twice a day as needed for nasal congestion.   Nasal saline spray (i.e., Simply Saline) or nasal saline lavage (i.e., NeilMed) is recommended as needed and prior to medicated nasal sprays.  Monitor symptoms - if they get worse will retest later.   Return in about 6 months (around 09/14/2020).  Diagnostics: Spirometry:  Tracings reviewed. Her effort: Good reproducible efforts. FVC: 2.94L FEV1: 2.16L, 85% predicted FEV1/FVC ratio: 73% Interpretation: Spirometry consistent with normal pattern.  Please see scanned spirometry results for details.  Medication List:  Current Outpatient Medications  Medication Sig Dispense Refill  . albuterol (VENTOLIN HFA) 108 (90 Base) MCG/ACT inhaler Inhale into the lungs every 6 (six)  hours as needed for wheezing or shortness of breath.    Marland Kitchen atorvastatin (LIPITOR) 10 MG tablet Take 10 mg by mouth daily.    . Cholecalciferol (VITAMIN D) 50 MCG (2000 UT)  tablet 1 tablet    . famotidine (PEPCID) 20 MG tablet Take 20 mg by mouth 2 (two) times daily.    . fenofibrate (TRICOR) 145 MG tablet Take 145 mg by mouth daily.    . fluticasone (FLONASE) 50 MCG/ACT nasal spray Place into both nostrils daily.    Marland Kitchen loratadine (CLARITIN) 10 MG tablet Take 10 mg by mouth daily.    Marland Kitchen losartan (COZAAR) 25 MG tablet Take 25 mg by mouth daily.    . Magnesium 250 MG TABS 1 tablet with a meal    . metFORMIN (GLUCOPHAGE) 500 MG tablet Take 500 mg by mouth 2 (two) times daily.    . Misc Natural Products (GLUCOSAMINE CHOND COMPLEX/MSM) TABS See admin instructions.    . montelukast (SINGULAIR) 10 MG tablet Take 1 tablet (10 mg total) by mouth at bedtime. 90 tablet 1  . omeprazole (PRILOSEC) 40 MG capsule Take 40 mg by mouth daily.    . Multiple Vitamins-Minerals (EMERGEN-C IMMUNE) PACK See admin instructions.     No current facility-administered medications for this visit.   Allergies: Allergies  Allergen Reactions  . Other Other (See Comments)   I reviewed her past medical history, social history, family history, and environmental history and no significant changes have been reported from her previous visit.  Review of Systems  Constitutional: Negative for appetite change, chills, fever and unexpected weight change.  HENT: Positive for rhinorrhea. Negative for congestion.   Eyes: Negative for itching.  Respiratory: Negative for cough, chest tightness, shortness of breath and wheezing.   Cardiovascular: Negative for chest pain.  Gastrointestinal: Negative for abdominal pain.  Genitourinary: Negative for difficulty urinating.  Skin: Negative for rash.  Allergic/Immunologic: Positive for environmental allergies.  Neurological: Negative for headaches.   Objective: BP 138/78   Pulse 82   Temp 98.7 F (37.1 C) (Temporal)   Resp 18   Ht 5\' 2"  (1.575 m)   SpO2 98%   BMI 31.61 kg/m  Body mass index is 31.61 kg/m. Physical Exam Vitals and nursing note  reviewed.  Constitutional:      Appearance: Normal appearance. She is well-developed.  HENT:     Head: Normocephalic and atraumatic.     Right Ear: External ear normal.     Left Ear: External ear normal.     Nose: Nose normal.     Mouth/Throat:     Mouth: Mucous membranes are moist.     Pharynx: Oropharynx is clear.  Eyes:     Conjunctiva/sclera: Conjunctivae normal.  Cardiovascular:     Rate and Rhythm: Normal rate and regular rhythm.     Heart sounds: Normal heart sounds. No murmur heard. No friction rub. No gallop.   Pulmonary:     Effort: Pulmonary effort is normal.     Breath sounds: Normal breath sounds. No wheezing, rhonchi or rales.  Abdominal:     Palpations: Abdomen is soft.  Musculoskeletal:     Cervical back: Neck supple.  Skin:    General: Skin is warm.     Findings: No rash.  Neurological:     Mental Status: She is alert and oriented to person, place, and time.  Psychiatric:        Behavior: Behavior normal.    Previous notes and tests were reviewed. The  plan was reviewed with the patient/family, and all questions/concerned were addressed.  It was my pleasure to see Ann Valenzuela today and participate in her care. Please feel free to contact me with any questions or concerns.  Sincerely,  Rexene Alberts, DO Allergy & Immunology  [facility] office: [place] office: [phone]

## 2020-03-17 ENCOUNTER — Other Ambulatory Visit: Payer: Self-pay

## 2020-03-17 ENCOUNTER — Encounter: Payer: Self-pay | Admitting: Allergy

## 2020-03-17 ENCOUNTER — Ambulatory Visit: Payer: 59 | Admitting: Allergy

## 2020-03-17 VITALS — BP 138/78 | HR 82 | Temp 98.7°F | Resp 18 | Ht 62.0 in

## 2020-03-17 DIAGNOSIS — J3089 Other allergic rhinitis: Secondary | ICD-10-CM | POA: Diagnosis not present

## 2020-03-17 DIAGNOSIS — H1013 Acute atopic conjunctivitis, bilateral: Secondary | ICD-10-CM | POA: Diagnosis not present

## 2020-03-17 DIAGNOSIS — J4520 Mild intermittent asthma, uncomplicated: Secondary | ICD-10-CM

## 2020-03-17 DIAGNOSIS — T781XXD Other adverse food reactions, not elsewhere classified, subsequent encounter: Secondary | ICD-10-CM

## 2020-03-17 NOTE — Assessment & Plan Note (Signed)
Doing well with no inhaler use.  Today's ACT score 25.  Today's spirometry was normal. . Daily controller medication(s): none. . During upper respiratory infections/asthma flares: start Alvesco 142mcg 2 puffs twice a day for 1-2 weeks until your breathing symptoms return to baseline.  . May use albuterol rescue inhaler 2 puffs every 4 to 6 hours as needed for shortness of breath, chest tightness, coughing, and wheezing. May use albuterol rescue inhaler 2 puffs 5 to 15 minutes prior to strenuous physical activities. Monitor frequency of use.

## 2020-03-17 NOTE — Assessment & Plan Note (Signed)
Past history - Rhinoconjunctivitis symptoms for 20+ years mainly in the spring and fall.  Currently on Singulair, azelastine nasal spray and Claritin with some benefit.  Last skin testing was over 7 years ago which showed multiple positives per patient report.  Allergy injections in [place] from 2007-2010 with unknown benefit. Records scanned.  Interim history - increased rhinorrhea the last few days due to heat/dry air? Wears CPAP at night.   Continue environmental control measures as below.  May use over the counter antihistamines such as Zyrtec (cetirizine), Claritin (loratadine), Allegra (fexofenadine), or Xyzal (levocetirizine) daily as needed.  Continue with montelukast (Singulair) 10mg  daily.   Try to use a humidifier in the home during the winter months.  Try saline spray/gel for the nose.  If no improvement then:   May use azelastine nasal spray 1-2 sprays per nostril twice a day as needed for runny nose/drainage.  May use Flonase (fluticasone) nasal spray 1 spray per nostril twice a day as needed for nasal congestion.   Nasal saline spray (i.e., Simply Saline) or nasal saline lavage (i.e., NeilMed) is recommended as needed and prior to medicated nasal sprays.  Monitor symptoms - if they get worse will retest later.

## 2020-03-17 NOTE — Patient Instructions (Addendum)
Environmental allergies  Continue environmental control measures as below.  May use over the counter antihistamines such as Zyrtec (cetirizine), Claritin (loratadine), Allegra (fexofenadine), or Xyzal (levocetirizine) daily as needed.  Continue with montelukast (singulair) 10mg  daily.   Try to use a humidifier in the home during the winter months.  Try saline spray/gel for the nose.  If no improvement then:   May use azelastine nasal spray 1-2 sprays per nostril twice a day as needed for runny nose/drainage.  May use Flonase (fluticasone) nasal spray 1 spray per nostril twice a day as needed for nasal congestion.   Nasal saline spray (i.e., Simply Saline) or nasal saline lavage (i.e., NeilMed) is recommended as needed and prior to medicated nasal sprays.  Monitor symptoms - if they get worse will retest later.   Asthma: . Normal breathing test today.  . Daily controller medication(s): none. . During upper respiratory infections/asthma flares: start Alvesco 142mcg 2 puffs twice a day for 1-2 weeks until your breathing symptoms return to baseline.  . May use albuterol rescue inhaler 2 puffs every 4 to 6 hours as needed for shortness of breath, chest tightness, coughing, and wheezing. May use albuterol rescue inhaler 2 puffs 5 to 15 minutes prior to strenuous physical activities. Monitor frequency of use.  . Asthma control goals:  o Full participation in all desired activities (may need albuterol before activity) o Albuterol use two times or less a week on average (not counting use with activity) o Cough interfering with sleep two times or less a month o Oral steroids no more than once a year o No hospitalizations  Follow up in 6 months or sooner if needed.   Reducing Pollen Exposure . Pollen seasons: trees (spring), grass (summer) and ragweed/weeds (fall). Marland Kitchen Keep windows closed in your home and car to lower pollen exposure.  Susa Simmonds air conditioning in the bedroom and  throughout the house if possible.  . Avoid going out in dry windy days - especially early morning. . Pollen counts are highest between 5 - 10 AM and on dry, hot and windy days.  . Save outside activities for late afternoon or after a heavy rain, when pollen levels are lower.  . Avoid mowing of grass if you have grass pollen allergy. Marland Kitchen Be aware that pollen can also be transported indoors on people and pets.  . Dry your clothes in an automatic dryer rather than hanging them outside where they might collect pollen.  . Rinse hair and eyes before bedtime.

## 2020-04-01 ENCOUNTER — Ambulatory Visit: Payer: Self-pay | Admitting: General Surgery

## 2020-04-01 DIAGNOSIS — R928 Other abnormal and inconclusive findings on diagnostic imaging of breast: Secondary | ICD-10-CM

## 2020-04-06 ENCOUNTER — Other Ambulatory Visit: Payer: Self-pay | Admitting: General Surgery

## 2020-04-06 DIAGNOSIS — R928 Other abnormal and inconclusive findings on diagnostic imaging of breast: Secondary | ICD-10-CM

## 2020-05-01 ENCOUNTER — Other Ambulatory Visit: Payer: Self-pay | Admitting: Allergy

## 2020-05-03 ENCOUNTER — Encounter (HOSPITAL_BASED_OUTPATIENT_CLINIC_OR_DEPARTMENT_OTHER): Payer: Self-pay | Admitting: General Surgery

## 2020-05-03 ENCOUNTER — Other Ambulatory Visit: Payer: Self-pay

## 2020-05-05 ENCOUNTER — Encounter (HOSPITAL_BASED_OUTPATIENT_CLINIC_OR_DEPARTMENT_OTHER)
Admission: RE | Admit: 2020-05-05 | Discharge: 2020-05-05 | Disposition: A | Discharge status: Home / Self Care | Payer: 59 | Source: Ambulatory Visit | Attending: General Surgery | Admitting: General Surgery

## 2020-05-05 DIAGNOSIS — Z01810 Encounter for preprocedural cardiovascular examination: Secondary | ICD-10-CM | POA: Diagnosis present

## 2020-05-05 LAB — BASIC METABOLIC PANEL
Anion gap: 8 (ref 5–15)
BUN: 18 mg/dL (ref 6–20)
CO2: 22 mmol/L (ref 22–32)
Calcium: 9.6 mg/dL (ref 8.9–10.3)
Chloride: 106 mmol/L (ref 98–111)
Creatinine, Ser: 0.63 mg/dL (ref 0.44–1.00)
GFR, Estimated: >60 mL/min (ref >60)
Glucose, Bld: 92 mg/dL (ref 70–99)
Potassium: 4.8 mmol/L (ref 3.5–5.1)
Sodium: 136 mmol/L (ref 135–145)

## 2020-05-05 NOTE — Progress Notes (Signed)
      Enhanced Recovery after Surgery for Orthopedics Enhanced Recovery after Surgery is a protocol used to improve the stress on your body and your recovery after surgery.  Patient Instructions  . The night before surgery:  o No food after midnight. ONLY clear liquids after midnight  . The day of surgery (if you do NOT have diabetes):  o Drink ONE (1) Pre-Surgery Clear Ensure as directed.   o This drink was given to you during your hospital  pre-op appointment visit. o The pre-op nurse will instruct you on the time to drink the  Pre-Surgery Ensure depending on your surgery time. o Finish the drink at the designated time by the pre-op nurse.  o Nothing else to drink after completing the  Pre-Surgery Clear Ensure.  . The day of surgery (if you have diabetes): o Drink ONE (1) Gatorade 2 (G2) as directed. o This drink was given to you during your hospital  pre-op appointment visit.  o The pre-op nurse will instruct you on the time to drink the   Gatorade 2 (G2) depending on your surgery time. o Color of the Gatorade may vary. Red is not allowed. o Nothing else to drink after completing the  Gatorade 2 (G2).         If you have questions, please contact your surgeon's office.  Surgical soap given with instructions. Pt verbalizes understanding. 

## 2020-05-06 ENCOUNTER — Other Ambulatory Visit (HOSPITAL_COMMUNITY)
Admission: RE | Admit: 2020-05-06 | Discharge: 2020-05-06 | Disposition: A | Discharge status: Home / Self Care | Payer: 59 | Source: Ambulatory Visit | Attending: General Surgery | Admitting: General Surgery

## 2020-05-06 DIAGNOSIS — Z01812 Encounter for preprocedural laboratory examination: Secondary | ICD-10-CM | POA: Diagnosis not present

## 2020-05-06 DIAGNOSIS — Z20822 Contact with and (suspected) exposure to covid-19: Secondary | ICD-10-CM | POA: Insufficient documentation

## 2020-05-06 LAB — SARS CORONAVIRUS 2 (TAT 6-24 HRS): SARS Coronavirus 2: NEGATIVE

## 2020-05-07 ENCOUNTER — Ambulatory Visit
Admission: RE | Admit: 2020-05-07 | Discharge: 2020-05-07 | Disposition: A | Discharge status: Home / Self Care | Payer: 59 | Source: Ambulatory Visit | Attending: General Surgery | Admitting: General Surgery

## 2020-05-07 ENCOUNTER — Other Ambulatory Visit: Payer: Self-pay

## 2020-05-07 DIAGNOSIS — R928 Other abnormal and inconclusive findings on diagnostic imaging of breast: Secondary | ICD-10-CM

## 2020-05-10 ENCOUNTER — Ambulatory Visit (HOSPITAL_BASED_OUTPATIENT_CLINIC_OR_DEPARTMENT_OTHER): Payer: 59 | Admitting: Certified Registered"

## 2020-05-10 ENCOUNTER — Encounter (HOSPITAL_BASED_OUTPATIENT_CLINIC_OR_DEPARTMENT_OTHER)
Admission: RE | Disposition: A | Discharge status: Home / Self Care | Payer: Self-pay | Source: Home / Self Care | Attending: General Surgery

## 2020-05-10 ENCOUNTER — Ambulatory Visit (HOSPITAL_BASED_OUTPATIENT_CLINIC_OR_DEPARTMENT_OTHER)
Admission: RE | Admit: 2020-05-10 | Discharge: 2020-05-10 | Disposition: A | Discharge status: Home / Self Care | Payer: 59 | Attending: General Surgery | Admitting: General Surgery

## 2020-05-10 ENCOUNTER — Ambulatory Visit
Admission: RE | Admit: 2020-05-10 | Discharge: 2020-05-10 | Disposition: A | Discharge status: Home / Self Care | Payer: 59 | Source: Ambulatory Visit | Attending: General Surgery | Admitting: General Surgery

## 2020-05-10 ENCOUNTER — Encounter (HOSPITAL_BASED_OUTPATIENT_CLINIC_OR_DEPARTMENT_OTHER): Payer: Self-pay | Admitting: General Surgery

## 2020-05-10 ENCOUNTER — Other Ambulatory Visit: Payer: Self-pay

## 2020-05-10 DIAGNOSIS — Z800 Family history of malignant neoplasm of digestive organs: Secondary | ICD-10-CM | POA: Diagnosis not present

## 2020-05-10 DIAGNOSIS — Z833 Family history of diabetes mellitus: Secondary | ICD-10-CM | POA: Insufficient documentation

## 2020-05-10 DIAGNOSIS — Z79899 Other long term (current) drug therapy: Secondary | ICD-10-CM | POA: Insufficient documentation

## 2020-05-10 DIAGNOSIS — Z8249 Family history of ischemic heart disease and other diseases of the circulatory system: Secondary | ICD-10-CM | POA: Insufficient documentation

## 2020-05-10 DIAGNOSIS — E119 Type 2 diabetes mellitus without complications: Secondary | ICD-10-CM | POA: Diagnosis not present

## 2020-05-10 DIAGNOSIS — Z803 Family history of malignant neoplasm of breast: Secondary | ICD-10-CM | POA: Diagnosis not present

## 2020-05-10 DIAGNOSIS — Z8041 Family history of malignant neoplasm of ovary: Secondary | ICD-10-CM | POA: Insufficient documentation

## 2020-05-10 DIAGNOSIS — Z888 Allergy status to other drugs, medicaments and biological substances status: Secondary | ICD-10-CM | POA: Diagnosis not present

## 2020-05-10 DIAGNOSIS — N6022 Fibroadenosis of left breast: Secondary | ICD-10-CM | POA: Insufficient documentation

## 2020-05-10 DIAGNOSIS — Z7984 Long term (current) use of oral hypoglycemic drugs: Secondary | ICD-10-CM | POA: Diagnosis not present

## 2020-05-10 DIAGNOSIS — Z823 Family history of stroke: Secondary | ICD-10-CM | POA: Insufficient documentation

## 2020-05-10 DIAGNOSIS — R928 Other abnormal and inconclusive findings on diagnostic imaging of breast: Secondary | ICD-10-CM

## 2020-05-10 DIAGNOSIS — N63 Unspecified lump in unspecified breast: Secondary | ICD-10-CM | POA: Diagnosis present

## 2020-05-10 DIAGNOSIS — D242 Benign neoplasm of left breast: Secondary | ICD-10-CM | POA: Diagnosis not present

## 2020-05-10 HISTORY — DX: Family history of other specified conditions: Z84.89

## 2020-05-10 HISTORY — DX: Other specified postprocedural states: R11.2

## 2020-05-10 HISTORY — DX: Gastro-esophageal reflux disease without esophagitis: K21.9

## 2020-05-10 HISTORY — DX: Type 2 diabetes mellitus without complications: E11.9

## 2020-05-10 HISTORY — DX: Sleep apnea, unspecified: G47.30

## 2020-05-10 HISTORY — DX: Other specified postprocedural states: Z98.890

## 2020-05-10 HISTORY — PX: BREAST LUMPECTOMY WITH RADIOACTIVE SEED LOCALIZATION: SHX6424

## 2020-05-10 HISTORY — DX: Personal history of urinary calculi: Z87.442

## 2020-05-10 LAB — GLUCOSE, CAPILLARY
Glucose-Capillary: 127 mg/dL — ABNORMAL HIGH (ref 70–99)
Glucose-Capillary: 136 mg/dL — ABNORMAL HIGH (ref 70–99)

## 2020-05-10 SURGERY — BREAST LUMPECTOMY WITH RADIOACTIVE SEED LOCALIZATION
Anesthesia: General | Site: Breast | Laterality: Left

## 2020-05-10 MED ORDER — SODIUM CHLORIDE (PF) 0.9 % IJ SOLN
INTRAMUSCULAR | Status: AC
  Filled 2020-05-10: qty 10

## 2020-05-10 MED ORDER — CELECOXIB 200 MG PO CAPS
200.0000 mg | ORAL_CAPSULE | ORAL | Status: AC
  Administered 2020-05-10: 200 mg via ORAL

## 2020-05-10 MED ORDER — DROPERIDOL 2.5 MG/ML IJ SOLN
INTRAMUSCULAR | Status: DC | PRN
  Administered 2020-05-10: .625 mg via INTRAVENOUS

## 2020-05-10 MED ORDER — CHLORHEXIDINE GLUCONATE CLOTH 2 % EX PADS: 6.0000 | MEDICATED_PAD | Freq: Once | CUTANEOUS | Status: DC

## 2020-05-10 MED ORDER — FENTANYL CITRATE (PF) 100 MCG/2ML IJ SOLN
INTRAMUSCULAR | Status: AC
  Filled 2020-05-10: qty 2

## 2020-05-10 MED ORDER — CEFAZOLIN SODIUM-DEXTROSE 2-4 GM/100ML-% IV SOLN
INTRAVENOUS | Status: AC
  Filled 2020-05-10: qty 100

## 2020-05-10 MED ORDER — ONDANSETRON HCL 4 MG/2ML IJ SOLN
INTRAMUSCULAR | Status: DC | PRN
  Administered 2020-05-10: 4 mg via INTRAVENOUS

## 2020-05-10 MED ORDER — PROPOFOL 500 MG/50ML IV EMUL
INTRAVENOUS | Status: DC | PRN
  Administered 2020-05-10: 150 ug/kg/min via INTRAVENOUS

## 2020-05-10 MED ORDER — HYDROMORPHONE HCL 1 MG/ML IJ SOLN
0.2500 mg | INTRAMUSCULAR | Status: DC | PRN
Start: 2020-05-10 — End: 2020-05-10

## 2020-05-10 MED ORDER — OXYCODONE HCL 5 MG PO TABS: 5.0000 mg | ORAL_TABLET | Freq: Once | ORAL | Status: DC | PRN

## 2020-05-10 MED ORDER — HYDROCODONE-ACETAMINOPHEN 5-325 MG PO TABS: 1.0000 | ORAL_TABLET | Freq: Four times a day (QID) | ORAL | 0 refills | Status: DC | PRN

## 2020-05-10 MED ORDER — OXYCODONE HCL 5 MG/5ML PO SOLN: 5.0000 mg | Freq: Once | ORAL | Status: DC | PRN

## 2020-05-10 MED ORDER — MIDAZOLAM HCL 2 MG/2ML IJ SOLN
INTRAMUSCULAR | Status: AC
  Filled 2020-05-10: qty 2

## 2020-05-10 MED ORDER — MIDAZOLAM HCL 5 MG/5ML IJ SOLN
INTRAMUSCULAR | Status: DC | PRN
  Administered 2020-05-10: 2 mg via INTRAVENOUS

## 2020-05-10 MED ORDER — ACETAMINOPHEN 500 MG PO TABS
1000.0000 mg | ORAL_TABLET | ORAL | Status: AC
  Administered 2020-05-10: 1000 mg via ORAL

## 2020-05-10 MED ORDER — GABAPENTIN 300 MG PO CAPS
ORAL_CAPSULE | ORAL | Status: AC
  Filled 2020-05-10: qty 1

## 2020-05-10 MED ORDER — BUPIVACAINE-EPINEPHRINE (PF) 0.25% -1:200000 IJ SOLN
INTRAMUSCULAR | Status: DC | PRN
  Administered 2020-05-10: 20 mL

## 2020-05-10 MED ORDER — ACETAMINOPHEN 500 MG PO TABS
ORAL_TABLET | ORAL | Status: AC
  Filled 2020-05-10: qty 2

## 2020-05-10 MED ORDER — DEXAMETHASONE SODIUM PHOSPHATE 4 MG/ML IJ SOLN
INTRAMUSCULAR | Status: DC | PRN
  Administered 2020-05-10: 4 mg via INTRAVENOUS

## 2020-05-10 MED ORDER — PROMETHAZINE HCL 25 MG/ML IJ SOLN
INTRAMUSCULAR | Status: AC
  Filled 2020-05-10: qty 1

## 2020-05-10 MED ORDER — PROPOFOL 10 MG/ML IV BOLUS
INTRAVENOUS | Status: DC | PRN
  Administered 2020-05-10: 200 mg via INTRAVENOUS

## 2020-05-10 MED ORDER — GABAPENTIN 300 MG PO CAPS
300.0000 mg | ORAL_CAPSULE | ORAL | Status: AC
  Administered 2020-05-10: 300 mg via ORAL

## 2020-05-10 MED ORDER — CELECOXIB 200 MG PO CAPS
ORAL_CAPSULE | ORAL | Status: AC
  Filled 2020-05-10: qty 1

## 2020-05-10 MED ORDER — FENTANYL CITRATE (PF) 100 MCG/2ML IJ SOLN
INTRAMUSCULAR | Status: DC | PRN
  Administered 2020-05-10 (×2): 25 ug via INTRAVENOUS
  Administered 2020-05-10: 50 ug via INTRAVENOUS
  Administered 2020-05-10: 25 ug via INTRAVENOUS

## 2020-05-10 MED ORDER — MEPERIDINE HCL 25 MG/ML IJ SOLN: 6.2500 mg | INTRAMUSCULAR | Status: DC | PRN

## 2020-05-10 MED ORDER — LACTATED RINGERS IV SOLN: INTRAVENOUS | Status: DC

## 2020-05-10 MED ORDER — CEFAZOLIN SODIUM-DEXTROSE 2-4 GM/100ML-% IV SOLN
2.0000 g | INTRAVENOUS | Status: AC
  Administered 2020-05-10: 2 g via INTRAVENOUS

## 2020-05-10 MED ORDER — PROMETHAZINE HCL 25 MG/ML IJ SOLN
6.2500 mg | INTRAMUSCULAR | Status: DC | PRN
Start: 2020-05-10 — End: 2020-05-10
  Administered 2020-05-10: 6.25 mg via INTRAVENOUS

## 2020-05-10 SURGICAL SUPPLY — 37 items
ADH SKN CLS APL DERMABOND .7 (GAUZE/BANDAGES/DRESSINGS) ×1
APL PRP STRL LF DISP 70% ISPRP (MISCELLANEOUS) ×1
APPLIER CLIP 9.375 MED OPEN (MISCELLANEOUS)
APR CLP MED 9.3 20 MLT OPN (MISCELLANEOUS)
BLADE SURG 15 STRL LF DISP TIS (BLADE) ×1 IMPLANT
BLADE SURG 15 STRL SS (BLADE) ×2
CANISTER SUC SOCK COL 7IN (MISCELLANEOUS) ×2 IMPLANT
CANISTER SUCT 1200ML W/VALVE (MISCELLANEOUS) ×2 IMPLANT
CHLORAPREP W/TINT 26 (MISCELLANEOUS) ×2 IMPLANT
CLIP APPLIE 9.375 MED OPEN (MISCELLANEOUS) IMPLANT
COVER BACK TABLE 60X90IN (DRAPES) ×2 IMPLANT
COVER MAYO STAND STRL (DRAPES) ×2 IMPLANT
COVER PROBE W GEL 5X96 (DRAPES) ×2 IMPLANT
DERMABOND ADVANCED (GAUZE/BANDAGES/DRESSINGS) ×1
DERMABOND ADVANCED .7 DNX12 (GAUZE/BANDAGES/DRESSINGS) ×1 IMPLANT
DRAPE LAPAROSCOPIC ABDOMINAL (DRAPES) ×2 IMPLANT
DRAPE UTILITY XL STRL (DRAPES) ×2 IMPLANT
ELECT COATED BLADE 2.86 ST (ELECTRODE) ×2 IMPLANT
ELECT REM PT RETURN 9FT ADLT (ELECTROSURGICAL) ×2
ELECTRODE REM PT RTRN 9FT ADLT (ELECTROSURGICAL) ×1 IMPLANT
GLOVE SURG ENC MOIS LTX SZ6.5 (GLOVE) ×2 IMPLANT
GLOVE SURG ENC MOIS LTX SZ7.5 (GLOVE) ×4 IMPLANT
GLOVE SURG UNDER POLY LF SZ7 (GLOVE) ×2 IMPLANT
GOWN STRL REUS W/ TWL LRG LVL3 (GOWN DISPOSABLE) ×2 IMPLANT
GOWN STRL REUS W/TWL LRG LVL3 (GOWN DISPOSABLE) ×4
KIT MARKER MARGIN INK (KITS) ×2 IMPLANT
NEEDLE HYPO 25X1 1.5 SAFETY (NEEDLE) ×2 IMPLANT
NS IRRIG 1000ML POUR BTL (IV SOLUTION) IMPLANT
PACK BASIN DAY SURGERY FS (CUSTOM PROCEDURE TRAY) ×2 IMPLANT
PENCIL SMOKE EVACUATOR (MISCELLANEOUS) ×2 IMPLANT
SLEEVE SCD COMPRESS KNEE MED (STOCKING) ×2 IMPLANT
SPONGE LAP 18X18 RF (DISPOSABLE) ×2 IMPLANT
SUT MON AB 4-0 PC3 18 (SUTURE) ×2 IMPLANT
SUT VICRYL 3-0 CR8 SH (SUTURE) ×2 IMPLANT
SYR CONTROL 10ML LL (SYRINGE) ×2 IMPLANT
TOWEL GREEN STERILE FF (TOWEL DISPOSABLE) ×2 IMPLANT
TRAY FAXITRON CT DISP (TRAY / TRAY PROCEDURE) ×2 IMPLANT

## 2020-05-10 NOTE — Anesthesia Postprocedure Evaluation (Signed)
Anesthesia Post Note  Patient: Ann Valenzuela  Procedure(s) Performed: LEFT BREAST LUMPECTOMY WITH RADIOACTIVE SEED LOCALIZATION (Left Breast)     Patient location during evaluation: PACU Anesthesia Type: General Level of consciousness: awake and alert Pain management: pain level controlled Vital Signs Assessment: post-procedure vital signs reviewed and stable Respiratory status: spontaneous breathing, nonlabored ventilation and respiratory function stable Cardiovascular status: blood pressure returned to baseline and stable Postop Assessment: no apparent nausea or vomiting Anesthetic complications: no   No complications documented.  Last Vitals:  Vitals:   05/10/20 0945 05/10/20 1029  BP: (!) 142/94 (!) 151/95  Pulse: 76 77  Resp: 15 20  Temp:  36.6 C  SpO2: 95% 97%    Last Pain:  Vitals:   05/10/20 1029  TempSrc: Oral  PainSc: 0-No pain                 Lynda Rainwater

## 2020-05-10 NOTE — H&P (Signed)
Ann Valenzuela  Location: [facility] Patient #: [id number] DOB: Jun 29, 1965 Married / Language: English / Race: White Female   History of Present Illness  The patient is a 55 year old female who presents with a complaint of Breast problems. We are asked to see the patient in consultation by Dr. Claudie Leach to evaluate her for a left breast abnormality. The patient is a 55 year old white female who recently went for a routine screening mammogram. She has dense breast tissue and has felt lumps in the upper and inner portion of the breast for quite some time. 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 On the left side she was found to have a small area of distortion in the upper outer left breast. This was biopsied and came back as fibrocystic tissue and usual ductal hyperplasia. This was felt to be discordant by the radiologist. She does have a family history of breast cancer in a maternal aunt. She has had genetics which are negative. She does not smoke.   Past Surgical History Breast Biopsy  Right. Hysterectomy (not due to cancer) - Partial  Oral Surgery   Diagnostic Studies History  Colonoscopy  1-5 years ago within last year Mammogram  within last year 1-3 years ago Pap Smear  1-5 years ago  Allergies hydroCHLOROthiazide *DIURETICS*  No Known Drug Allergies  Allergies Reconciled   Medication History  Montelukast Sodium (10MG  Tablet, Oral) Active. Losartan Potassium (25MG  Tablet, Oral) Active. Atorvastatin Calcium (10MG  Tablet, Oral) Active. metFORMIN HCl (500MG  Tablet, Oral) Active. Fenofibrate (145MG  Tablet, Oral) Active. Claritin (Oral) Specific strength unknown - Active. Medications Reconciled  Social History Alcohol use  Occasional alcohol use. Caffeine use  Carbonated beverages, Coffee, Tea. Illicit drug use  Prefer to discuss with provider. No drug use   Tobacco use  Never smoker.  Family History  Anesthetic complications  Father. Arthritis  Mother. Breast Cancer  Family Members In General. Cerebrovascular Accident  Family Members In General. Cervical Cancer  Family Members In [place] Members In General. Depression  Daughter, Mother. Diabetes Mellitus  Daughter, Family Members In General. Heart Disease  Family Members In General, Father. Hypertension  Father. Ovarian Cancer  Family Members In General. Respiratory Condition  Family Members In General, Sister.  Pregnancy / Birth History  Age at menarche  76 years. Contraceptive History  Oral contraceptives. Gravida  3 Irregular periods  Length (months) of breastfeeding  >24 Maternal age  19-25 Para  3  Other Problems  Asthma  Diabetes Mellitus  Gastroesophageal Reflux Disease  General anesthesia - complications  High blood pressure  Hypercholesterolemia  Kidney Stone  Lump In Breast  Sleep Apnea     Review of Systems  General Not Present- Appetite Loss, Chills, Fatigue, Fever, Night Sweats, Weight Gain and Weight Loss. Skin Not Present- Change in Wart/Mole, Dryness, Hives, Jaundice, New Lesions, Non-Healing Wounds, Rash and Ulcer. HEENT Present- Seasonal Allergies and Wears glasses/contact lenses. Not Present- Earache, Hearing Loss, Hoarseness, Nose Bleed, Oral Ulcers, Ringing in the Ears, Sinus Pain, Sore Throat, Visual Disturbances and Yellow Eyes. Respiratory Present- Snoring. Not Present- Bloody sputum, Chronic Cough, Difficulty Breathing and Wheezing. Breast Present- Breast Mass. Not Present- Breast Pain, Nipple Discharge and Skin Changes. Cardiovascular Present- Swelling of Extremities. Not Present- Chest Pain, Difficulty Breathing Lying Down, Leg Cramps, Palpitations, Rapid Heart Rate and Shortness of Breath. Gastrointestinal Not Present- Abdominal Pain, Bloating, Bloody Stool, Change in Bowel Habits,  Chronic  diarrhea, Constipation, Difficulty Swallowing, Excessive gas, Gets full quickly at meals, Hemorrhoids, Indigestion, Nausea, Rectal Pain and Vomiting. Female Genitourinary Present- Pelvic Pain. Not Present- Frequency, Nocturia, Painful Urination and Urgency. Musculoskeletal Present- Joint Pain and Joint Stiffness. Not Present- Back Pain, Muscle Pain, Muscle Weakness and Swelling of Extremities. Neurological Present- Tingling. Not Present- Decreased Memory, Fainting, Headaches, Numbness, Seizures, Tremor, Trouble walking and Weakness. Psychiatric Not Present- Anxiety, Bipolar, Change in Sleep Pattern, Depression, Fearful and Frequent crying. Endocrine Present- New Diabetes. Not Present- Cold Intolerance, Excessive Hunger, Hair Changes, Heat Intolerance and Hot flashes. Hematology Not Present- Blood Thinners, Easy Bruising, Excessive bleeding, Gland problems, HIV and Persistent Infections.  Vitals Weight: 174.38 lb Height: 63in Body Surface Area: 1.82 m Body Mass Index: 30.89 kg/m  Temp.: 98.35F  Pulse: 92 (Regular)        Physical Exam General Mental Status-Alert. General Appearance-Consistent with stated age. Hydration-Well hydrated. Voice-Normal.  Head and Neck Head-normocephalic, atraumatic with no lesions or palpable masses. Trachea-midline. Thyroid Gland Characteristics - normal size and consistency.  Eye Eyeball - Bilateral-Extraocular movements intact. Sclera/Conjunctiva - Bilateral-No scleral icterus.  Chest and Lung Exam Chest and lung exam reveals -quiet, even and easy respiratory effort with no use of accessory muscles and on auscultation, normal breath sounds, no adventitious sounds and normal vocal resonance. Inspection Chest Wall - Normal. Back - normal.  Breast Note: There is quite dense symmetric breast tissue bilaterally. Other than this there is no discrete palpable mass that we can feel today. There is no palpable axillary,  supraclavicular, or cervical lymphadenopathy.   Cardiovascular Cardiovascular examination reveals -normal heart sounds, regular rate and rhythm with no murmurs and normal pedal pulses bilaterally.  Abdomen Inspection Inspection of the abdomen reveals - No Hernias. Skin - Scar - no surgical scars. Palpation/Percussion Palpation and Percussion of the abdomen reveal - Soft, Non Tender, No Rebound tenderness, No Rigidity (guarding) and No hepatosplenomegaly. Auscultation Auscultation of the abdomen reveals - Bowel sounds normal.  Neurologic Neurologic evaluation reveals -alert and oriented x 3 with no impairment of recent or remote memory. Mental Status-Normal.  Musculoskeletal Normal Exam - Left-Upper Extremity Strength Normal and Lower Extremity Strength Normal. Normal Exam - Right-Upper Extremity Strength Normal and Lower Extremity Strength Normal.  Lymphatic Head & Neck  General Head & Neck Lymphatics: Bilateral - Description - Normal. Axillary  General Axillary Region: Bilateral - Description - Normal. Tenderness - Non Tender. Femoral & Inguinal  Generalized Femoral & Inguinal Lymphatics: Bilateral - Description - Normal. Tenderness - Non Tender.    Assessment & Plan  ABNORMAL MAMMOGRAM OF LEFT BREAST (R92.8) Impression: The patient appears to have a small area of the discordance in the upper outer left breast. Because of its abnormal appearance and because there is a 5-10% chance of missing something more significant than the recommendation is to have this area removed. I have discussed with her in detail the risks and benefits of the operation to do this as well as some of the technical aspects including the use of a radioactive seed for localization and she understands. She would like to talk to her family first but will likely proceed with surgery. She will call when she is ready to schedule. This patient encounter took 45 minutes today to perform the following:  take history, perform exam, review outside records, interpret imaging, counsel the patient on their diagnosis and document encounter, findings & plan in the EHR

## 2020-05-10 NOTE — Anesthesia Preprocedure Evaluation (Signed)
Anesthesia Evaluation  Patient identified by MRN, date of birth, ID band Patient awake    Reviewed: Allergy & Precautions, NPO status , Patient's Chart, lab work & pertinent test results  History of Anesthesia Complications (+) PONV  Airway Mallampati: II  TM Distance: >3 FB Neck ROM: Full    Dental no notable dental hx.    Pulmonary asthma , sleep apnea ,    Pulmonary exam normal breath sounds clear to auscultation       Cardiovascular hypertension, Pt. on medications negative cardio ROS Normal cardiovascular exam Rhythm:Regular Rate:Normal     Neuro/Psych negative neurological ROS  negative psych ROS   GI/Hepatic Neg liver ROS, GERD  ,  Endo/Other  negative endocrine ROSdiabetes, Type 2, Oral Hypoglycemic Agents  Renal/GU negative Renal ROS  negative genitourinary   Musculoskeletal negative musculoskeletal ROS (+)   Abdominal (+) + obese,   Peds negative pediatric ROS (+)  Hematology negative hematology ROS (+)   Anesthesia Other Findings   Reproductive/Obstetrics negative OB ROS                             Anesthesia Physical Anesthesia Plan  ASA: III  Anesthesia Plan: General   Post-op Pain Management:    Induction: Intravenous  PONV Risk Score and Plan: 4 or greater and Ondansetron, Dexamethasone, Midazolam, Droperidol and Treatment may vary due to age or medical condition  Airway Management Planned: LMA  Additional Equipment:   Intra-op Plan:   Post-operative Plan: Extubation in OR  Informed Consent: I have reviewed the patients History and Physical, chart, labs and discussed the procedure including the risks, benefits and alternatives for the proposed anesthesia with the patient or authorized representative who has indicated his/her understanding and acceptance.     Dental advisory given  Plan Discussed with: CRNA  Anesthesia Plan Comments:          Anesthesia Quick Evaluation

## 2020-05-10 NOTE — Discharge Instructions (Signed)
May take Ibuprofen after 1pm, if needed.    Post Anesthesia Home Care Instructions  Activity: Get plenty of rest for the remainder of the day. A responsible individual must stay with you for 24 hours following the procedure.  For the next 24 hours, DO NOT: -Drive a car -Paediatric nurse -Drink alcoholic beverages -Take any medication unless instructed by your physician -Make any legal decisions or sign important papers.  Meals: Start with liquid foods such as gelatin or soup. Progress to regular foods as tolerated. Avoid greasy, spicy, heavy foods. If nausea and/or vomiting occur, drink only clear liquids until the nausea and/or vomiting subsides. Call your physician if vomiting continues.  Special Instructions/Symptoms: Your throat may feel dry or sore from the anesthesia or the breathing tube placed in your throat during surgery. If this causes discomfort, gargle with warm salt water. The discomfort should disappear within 24 hours.  If you had a scopolamine patch placed behind your ear for the management of post- operative nausea and/or vomiting:  1. The medication in the patch is effective for 72 hours, after which it should be removed.  Wrap patch in a tissue and discard in the trash. Wash hands thoroughly with soap and water. 2. You may remove the patch earlier than 72 hours if you experience unpleasant side effects which may include dry mouth, dizziness or visual disturbances. 3. Avoid touching the patch. Wash your hands with soap and water after contact with the patch.

## 2020-05-10 NOTE — Transfer of Care (Signed)
Immediate Anesthesia Transfer of Care Note  Patient: Ann Valenzuela  Procedure(s) Performed: LEFT BREAST LUMPECTOMY WITH RADIOACTIVE SEED LOCALIZATION (Left Breast)  Patient Location: PACU  Anesthesia Type:General  Level of Consciousness: awake, alert , oriented and patient cooperative  Airway & Oxygen Therapy: Patient Spontanous Breathing and Patient connected to face mask oxygen  Post-op Assessment: Report given to RN and Post -op Vital signs reviewed and stable  Post vital signs: Reviewed and stable  Last Vitals:  Vitals Value Taken Time  BP    Temp    Pulse    Resp 16 05/10/20 0923  SpO2    Vitals shown include unvalidated device data.  Last Pain:  Vitals:   05/10/20 0713  TempSrc: Oral  PainSc: 0-No pain         Complications: No complications documented.

## 2020-05-10 NOTE — Anesthesia Procedure Notes (Signed)
Procedure Name: LMA Insertion Date/Time: 05/10/2020 8:30 AM Performed by: Signe Colt, CRNA Pre-anesthesia Checklist: Patient identified, Emergency Drugs available, Suction available and Patient being monitored Patient Re-evaluated:Patient Re-evaluated prior to induction Oxygen Delivery Method: Circle System Utilized Preoxygenation: Pre-oxygenation with 100% oxygen Induction Type: IV induction Ventilation: Mask ventilation without difficulty LMA: LMA inserted LMA Size: 4.0 Number of attempts: 1 Airway Equipment and Method: bite block Placement Confirmation: positive ETCO2 Tube secured with: Tape Dental Injury: Teeth and Oropharynx as per pre-operative assessment

## 2020-05-10 NOTE — Op Note (Signed)
05/10/2020  9:18 AM  PATIENT:  Ann Valenzuela  55 y.o. female  PRE-OPERATIVE DIAGNOSIS:  LEFT BREAST DISCORDANCE  POST-OPERATIVE DIAGNOSIS:  LEFT BREAST DISCORDANCE  PROCEDURE:  Procedure(s): LEFT BREAST LUMPECTOMY WITH RADIOACTIVE SEED LOCALIZATION (Left)  SURGEON:  Surgeon(s) and Role:    * Jovita Kussmaul, MD - Primary  PHYSICIAN ASSISTANT:   ASSISTANTS: none   ANESTHESIA:   local and general  EBL:  Minimal   BLOOD ADMINISTERED:none  DRAINS: none   LOCAL MEDICATIONS USED:  MARCAINE     SPECIMEN:  Source of Specimen:  left breast tissue  DISPOSITION OF SPECIMEN:  PATHOLOGY  COUNTS:  YES  TOURNIQUET:  * No tourniquets in log *  DICTATION: .Dragon Dictation   After informed consent was obtained the patient was brought to the operating room and placed in the supine position on the operating table.  After adequate induction of general anesthesia the patient's left breast was prepped with ChloraPrep, allowed to dry, and draped in usual sterile manner.  An appropriate timeout was performed.  Previously an I-125 seed was placed in the outer aspect of the left breast to mark an area of discordance.  The neoprobe was set to I-125 in the area of radioactivity was readily identified.  The area around this was infiltrated with quarter percent Marcaine.  A curvilinear incision was made along the outer edge of the areola of the left breast with a 15 blade knife.  The incision was carried through the skin and subcutaneous tissue sharply with the electrocautery.  The dissection was then carried through the breast tissue under the direction of the neoprobe.  Once I more closely approached the radioactive seed I then removed a circular portion of breast tissue sharply with the electrocautery around the radioactive seed while checking the area of radioactivity frequently.  Once the specimen was removed it was oriented with the appropriate paint colors.  A specimen radiograph was obtained that  showed the clip and seed to be within the specimen.  The specimen was then sent to pathology for further evaluation.  Hemostasis was achieved using the Bovie electrocautery.  The wound was irrigated with saline and infiltrated with more quarter percent Marcaine.  The deep layer of the wound was then closed with layers of interrupted 3-0 Vicryl stitches.  The skin was closed with interrupted 4-0 Monocryl subcuticular stitches.  Dermabond dressings were applied.  The patient tolerated the procedure well.  At the end of the case all needle sponge and instrument counts were correct.  The patient was then awakened and taken to recovery in stable condition.  PLAN OF CARE: Discharge to home after PACU  PATIENT DISPOSITION:  PACU - hemodynamically stable.   Delay start of Pharmacological VTE agent (>24hrs) due to surgical blood loss or risk of bleeding: not applicable

## 2020-05-10 NOTE — Interval H&P Note (Signed)
History and Physical Interval Note:  05/10/2020 8:13 AM  Ann Valenzuela  has presented today for surgery, with the diagnosis of LEFT BREAST DISCORDANCE.  The various methods of treatment have been discussed with the patient and family. After consideration of risks, benefits and other options for treatment, the patient has consented to  Procedure(s) with comments: LEFT BREAST LUMPECTOMY WITH RADIOACTIVE SEED LOCALIZATION (Left) - START TIME OF 8:30 AM FOR [place] [facility] as a surgical intervention.  The patient's history has been reviewed, patient examined, no change in status, stable for surgery.  I have reviewed the patient's chart and labs.  Questions were answered to the patient's satisfaction.     [name]

## 2020-05-11 ENCOUNTER — Encounter (HOSPITAL_BASED_OUTPATIENT_CLINIC_OR_DEPARTMENT_OTHER): Payer: Self-pay | Admitting: General Surgery

## 2020-05-17 LAB — SURGICAL PATHOLOGY

## 2020-06-15 ENCOUNTER — Encounter: Payer: Self-pay | Admitting: Nurse Practitioner

## 2020-06-23 ENCOUNTER — Other Ambulatory Visit: Payer: Self-pay

## 2020-06-23 DIAGNOSIS — E7800 Pure hypercholesterolemia, unspecified: Secondary | ICD-10-CM | POA: Insufficient documentation

## 2020-06-23 DIAGNOSIS — I10 Essential (primary) hypertension: Secondary | ICD-10-CM | POA: Insufficient documentation

## 2020-06-23 DIAGNOSIS — E1169 Type 2 diabetes mellitus with other specified complication: Secondary | ICD-10-CM | POA: Insufficient documentation

## 2020-06-23 DIAGNOSIS — G4733 Obstructive sleep apnea (adult) (pediatric): Secondary | ICD-10-CM | POA: Insufficient documentation

## 2020-06-23 DIAGNOSIS — K219 Gastro-esophageal reflux disease without esophagitis: Secondary | ICD-10-CM | POA: Insufficient documentation

## 2020-06-25 ENCOUNTER — Other Ambulatory Visit: Payer: Self-pay | Admitting: Allergy

## 2020-07-01 ENCOUNTER — Ambulatory Visit: Payer: 59 | Admitting: Nurse Practitioner

## 2020-07-01 ENCOUNTER — Other Ambulatory Visit: Payer: Self-pay

## 2020-07-01 ENCOUNTER — Encounter: Payer: Self-pay | Admitting: Nurse Practitioner

## 2020-07-01 VITALS — BP 134/72 | HR 82 | Ht 62.0 in | Wt 173.0 lb

## 2020-07-01 DIAGNOSIS — J45909 Unspecified asthma, uncomplicated: Secondary | ICD-10-CM | POA: Diagnosis not present

## 2020-07-01 DIAGNOSIS — K219 Gastro-esophageal reflux disease without esophagitis: Secondary | ICD-10-CM | POA: Diagnosis not present

## 2020-07-01 MED ORDER — OMEPRAZOLE 40 MG PO CPDR: 40.0000 mg | DELAYED_RELEASE_CAPSULE | Freq: Two times a day (BID) | ORAL | 1 refills | Status: DC

## 2020-07-01 NOTE — Patient Instructions (Addendum)
If you are age 54 or younger, your body mass index should be between 19-25. Your Body mass index is 31.64 kg/m. If this is out of the aformentioned range listed, please consider follow up with your Primary Care Provider.   It has been recommended to you by your physician that you have a(n) EGD completed. Per your request, we did not schedule the procedure(s) today. Please contact our office at [phone] should you decide to have the procedure completed. You will be scheduled for a pre-visit and procedure at that time.  Take Omeprazole 40 MG twice a day. You may take Pepcid as needed.  It was great seeing you today! Thank you for entrusting me with your care and choosing [facility].  Noralyn Pick, CRNP   Gastroesophageal Reflux Disease, Adult  Gastroesophageal reflux (GER) happens when acid from the stomach flows up into the tube that connects the mouth and the stomach (esophagus). Normally, food travels down the esophagus and stays in the stomach to be digested. With GER, food and stomach acid sometimes move back up into the esophagus. You may have a disease called gastroesophageal reflux disease (GERD) if the reflux:  Happens often.  Causes frequent or very bad symptoms.  Causes problems such as damage to the esophagus. When this happens, the esophagus becomes sore and swollen. Over time, GERD can make small holes (ulcers) in the lining of the esophagus. What are the causes? This condition is caused by a problem with the muscle between the esophagus and the stomach. When this muscle is weak or not normal, it does not close properly to keep food and acid from coming back up from the stomach. The muscle can be weak because of:  Tobacco use.  Pregnancy.  Having a certain type of hernia (hiatal hernia).  Alcohol use.  Certain foods and drinks, such as coffee, chocolate, onions, and peppermint. What increases the risk?  Being overweight.  Having a disease  that affects your connective tissue.  Taking NSAIDs, such a ibuprofen. What are the signs or symptoms?  Heartburn.  Difficult or painful swallowing.  The feeling of having a lump in the throat.  A bitter taste in the mouth.  Bad breath.  Having a lot of saliva.  Having an upset or bloated stomach.  Burping.  Chest pain. Different conditions can cause chest pain. Make sure you see your doctor if you have chest pain.  Shortness of breath or wheezing.  A long-term cough or a cough at night.  Wearing away of the surface of teeth (tooth enamel).  Weight loss. How is this treated?  Making changes to your diet.  Taking medicine.  Having surgery. Treatment will depend on how bad your symptoms are. Follow these instructions at home: Eating and drinking  Follow a diet as told by your doctor. You may need to avoid foods and drinks such as: ? Coffee and tea, with or without caffeine. ? Drinks that contain alcohol. ? Energy drinks and sports drinks. ? Bubbly (carbonated) drinks or sodas. ? Chocolate and cocoa. ? Peppermint and mint flavorings. ? Garlic and onions. ? Horseradish. ? Spicy and acidic foods. These include peppers, chili powder, curry powder, vinegar, hot sauces, and BBQ sauce. ? Citrus fruit juices and citrus fruits, such as oranges, lemons, and limes. ? Tomato-based foods. These include red sauce, chili, salsa, and pizza with red sauce. ? Fried and fatty foods. These include donuts, french fries, potato chips, and high-fat dressings. ? High-fat meats. These include  hot dogs, rib eye steak, sausage, ham, and bacon. ? High-fat dairy items, such as whole milk, butter, and cream cheese.  Eat small meals often. Avoid eating large meals.  Avoid drinking large amounts of liquid with your meals.  Avoid eating meals during the 2-3 hours before bedtime.  Avoid lying down right after you eat.  Do not exercise right after you eat.   Lifestyle  Do not smoke or  use any products that contain nicotine or tobacco. If you need help quitting, ask your doctor.  Try to lower your stress. If you need help doing this, ask your doctor.  If you are overweight, lose an amount of weight that is healthy for you. Ask your doctor about a safe weight loss goal.   General instructions  Pay attention to any changes in your symptoms.  Take over-the-counter and prescription medicines only as told by your doctor.  Do not take aspirin, ibuprofen, or other NSAIDs unless your doctor says it is okay.  Wear loose clothes. Do not wear anything tight around your waist.  Raise (elevate) the head of your bed about 6 inches (15 cm). You may need to use a wedge to do this.  Avoid bending over if this makes your symptoms worse.  Keep all follow-up visits. Contact a doctor if:  You have new symptoms.  You lose weight and you do not know why.  You have trouble swallowing or it hurts to swallow.  You have wheezing or a cough that keeps happening.  You have a hoarse voice.  Your symptoms do not get better with treatment. Get help right away if:  You have sudden pain in your arms, neck, jaw, teeth, or back.  You suddenly feel sweaty, dizzy, or light-headed.  You have chest pain or shortness of breath.  You vomit and the vomit is green, yellow, or black, or it looks like blood or coffee grounds.  You faint.  Your poop (stool) is red, bloody, or black.  You cannot swallow, drink, or eat. These symptoms may represent a serious problem that is an emergency. Do not wait to see if the symptoms will go away. Get medical help right away. Call your local emergency services (911 in the [place] Do not drive yourself to the hospital. Summary  If a person has gastroesophageal reflux disease (GERD), food and stomach acid move back up into the esophagus and cause symptoms or problems such as damage to the esophagus.  Treatment will depend on how bad your symptoms  are.  Follow a diet as told by your doctor.  Take all medicines only as told by your doctor. This information is not intended to replace advice given to you by your health care provider. Make sure you discuss any questions you have with your health care provider. Document Revised: 08/18/2019 Document Reviewed: 08/18/2019 Elsevier Patient Education  [place].

## 2020-07-01 NOTE — Progress Notes (Signed)
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  She moved from [place] to [facility] June 2021.  She presents to our office today as referred by Claudie Leach PA-C for further evaluation regarding GERD symptoms.  She describes having acid reflux symptoms which consist of acid which backs up from the upper esophagus into her throat with associated cough.  She stated being on Omeprazole QD since 2016.  She saw a GI in [place] and she was prescribed Omeprazole daily and Famotidine 20 mg daily was added 05/2019.  She denies ever having an EGD.  Her reflux symptoms initially seem to improve but over the past year she is having more episodes. She awakens in the morning with acid reflux and cough which occurs 3 to 4 days weekly for the past year.  She has difficulty swallowing large pills for the past 6 to 12 months.  No upper or lower abdominal pain.  No nausea or vomiting.  She is passing a normal formed brown bowel movement daily.  She takes Ibuprofen 200 mg 2 tabs once weekly for aches and pains.  She underwent a colonoscopy in [place] 01/2019 which she reported was normal and she was advised to repeat a colonoscopy in 10 years.  She stated her maternal great aunt and a first cousin had colon cancer.  She reports having extreme nausea and vomiting with sedation and anesthesia.  Father had aspiration with anesthesia.  She reports having a diagnosis of fatty liver based on past laboratory studies which apparently showed elevated LFTs.  She denies ever having abdominal sonogram.  Her last laboratory studies done by her [facility] PCP 12/2019 showed normal LFTs.  No other complaints at this time.  Laboratory studies 01/20/2020: Glucose 114.  BUN 20.   Creatinine 0.73. Sodium 142.  Potassium 4.6.  Total bili 1.0.  Alk phos 47.  AST 19.  ALT 24. Laboratory studies 10/09/2019: WBC 9.8.  Hemoglobin 14.3.  Medic at 41.2.  Platelet 323.   Past Medical History:  Diagnosis Date  . Asthma   . Diabetes mellitus without complication ([place])   . Family history of adverse reaction to anesthesia    father aspirated after surgery due to severe reflux  . GERD (gastroesophageal reflux disease)   . History of kidney stones   . Hypertension   . PONV (postoperative nausea and vomiting)   . Sleep apnea   . Uncomplicated asthma 1/49/7026   Past Surgical History:  Procedure Laterality Date  . ABDOMINAL HYSTERECTOMY    . BREAST LUMPECTOMY WITH RADIOACTIVE SEED LOCALIZATION Left 05/10/2020   Procedure: LEFT BREAST LUMPECTOMY WITH RADIOACTIVE SEED LOCALIZATION;  Surgeon: Jovita Kussmaul, MD;  Location: [facility];  Service: General;  Laterality: Left;  . BREAST SURGERY     right breast biopsy, cyst removal  . kidney stone removal     Social History: She is married.  She has 2 sons and 1 daughter.  She is a Pharmacist, hospital.  Non-smoker.  She drinks 1 alcoholic beverage daily or less.  No drug use.  Family History: Father with history of heart disease status post quadruple bypass.  Maternal great aunt with colon cancer. First cousin with history of colon cancer. Maternal grandfather esophageal cancer. Maternal aunt ovarian cancer. [place]en[place]  aunt pancreatic cancer.  Paternal aunt with pancreatic cancer.  Maternal aunt with ovarian cancer.  Allergies  Allergen Reactions  . Hydrochlorothiazide Cough  . Lisinopril Cough  . Other Other (See Comments)      Outpatient Encounter Medications as of 07/01/2020  Medication Sig  . albuterol (VENTOLIN HFA) 108 (90 Base) MCG/ACT inhaler INHALE 2 PUFFS BY MOUTH EVERY 4 TO 6 HOURS AS NEEDED  . Ascorbic Acid (VITAMIN C) 1000 MG tablet Take 1,000 mg by mouth daily.  Marland Kitchen atorvastatin (LIPITOR) 10 MG tablet Take 10 mg by  mouth daily.  . Azelastine HCl 137 MCG/SPRAY SOLN 1 puff in each nostril  . Cholecalciferol (VITAMIN D) 50 MCG (2000 UT) tablet 1 tablet  . ciclesonide (ALVESCO) 160 MCG/ACT inhaler 1 puff  . famotidine (PEPCID) 20 MG tablet Take 20 mg by mouth 2 (two) times daily.  . fenofibrate (TRICOR) 145 MG tablet Take 145 mg by mouth daily.  . fluticasone (FLONASE) 50 MCG/ACT nasal spray Place into both nostrils daily.  Marland Kitchen HYDROcodone-acetaminophen (NORCO/VICODIN) 5-325 MG tablet Take 1-2 tablets by mouth every 6 (six) hours as needed for moderate pain or severe pain.  Marland Kitchen levocetirizine (XYZAL) 5 MG tablet 1 tablet in the evening  . loratadine (CLARITIN) 10 MG tablet Take 10 mg by mouth daily.  Marland Kitchen losartan (COZAAR) 25 MG tablet Take 25 mg by mouth daily.  . Magnesium 250 MG TABS 1 tablet with a meal  . metFORMIN (GLUCOPHAGE) 500 MG tablet Take 500 mg by mouth 2 (two) times daily.  . Misc Natural Products (GLUCOSAMINE CHOND COMPLEX/MSM) TABS See admin instructions.  . montelukast (SINGULAIR) 10 MG tablet TAKE 1 TABLET(10 MG) BY MOUTH AT BEDTIME  . Multiple Vitamins-Minerals (EMERGEN-C IMMUNE) PACK See admin instructions.  Marland Kitchen omeprazole (PRILOSEC OTC) 20 MG tablet 2 capsules  . omeprazole (PRILOSEC) 40 MG capsule Take 40 mg by mouth daily.  . Zinc Sulfate (ZINC 15 PO) Take 30 mg by mouth.   No facility-administered encounter medications on file as of 07/01/2020.    REVIEW OF SYSTEMS:  Gen: + Night sweats. No weight loss.  CV: Denies chest pain, palpitations or edema. Resp:See HPI.  GI: See HPI.  GU : Denies urinary burning, blood in urine, increased urinary frequency or incontinence. MS: Denies joint pain, muscles aches or weakness. Derm: Denies rash, itchiness, skin lesions or unhealing ulcers. Psych: Denies depression, anxiety or memory loss. Heme: Denies bruising, bleeding. Neuro:  Denies headaches, dizziness or paresthesias. Endo:  + DM II.   PHYSICAL EXAM: BP 134/72   Pulse 82   Ht _0   (1.575 m)   Wt 173 lb (78.5 kg)   BMI 31.64 kg/m  General: 55 year old female in no acute distress. Head: Normocephalic and atraumatic. Eyes:  Sclerae non-icteric, conjunctive pink. Ears: Normal auditory acuity. Mouth: Dentition intact. No ulcers or lesions.  Neck: Supple, no lymphadenopathy or thyromegaly.  Lungs: Clear bilaterally to auscultation without wheezes, crackles or rhonchi. Heart: Regular rate and rhythm. No murmur, rub or gallop appreciated.  Abdomen: Soft, nontender, non distended. No masses. No hepatosplenomegaly. Normoactive bowel sounds x 4 quadrants.  Rectal: Deferred.  Musculoskeletal: Symmetrical with no gross deformities. Skin: Warm and dry. No rash or lesions on visible extremities. Extremities: No edema. Neurological: Alert oriented x 4, no focal deficits.  Psychological:  Alert and cooperative. Normal mood and affect.  ASSESSMENT AND PLAN:  103.  55 year old female with reflux symptoms, cough and large pill dysphagia -Increase omeprazole 40 mg p.o. twice daily.  May take Famotidine 8m po as needed. -GERD handout -EGD benefits and risks discussed including risk with sedation, risk of bleeding, perforation and infection. [facility] She will call our office when she is ready to schedule an EGD. Note patient has significant N/V with sedation/anesthesia.  -Patient to call our office if her symptoms worsen  2.  History of asthma, persistent cough possibly due to acid reflux  3.  Colon cancer screening.  Patient reported normal colonoscopy 01/2019.  Maternal great aunt and maternal first cousin with history of colon cancer. -Dr. AHavery Morosto verify appropriate colonoscopy recall date  428  Patient reported history of fatty liver and had questions regarding this condition.  I reviewed her laboratory studies 12/2018 which showed normal LFTs.  -I explained to the patient if she wishes to undergo further evaluation for fatty liver  an abdominal sonogram would be required.  Her 12/2019 laboratory studies showed a normal AST and ALT levels.  I recommended for her to have annual labs with her PCP to check her liver enzymes, to maintain a low-fat diet, exercise as tolerated and to lose weight to reduce the risk of fatty liver disease.  5. DM II  Further follow-up to be determined after the above evaluation completed        CC:  [place], NBarth Kirks PA

## 2020-07-03 NOTE — Progress Notes (Signed)
Agree with assessment and plan as outlined. EGD is reasonable given her history of symptoms, she can call to schedule when she is ready to proceed. If her last colonoscopy showed no polyps and prep was good, she does not need another exam for 10 years. Her second degree family members with colon cancer itself is not a strong enough family history to warrant more frequent screening exams.

## 2020-07-27 ENCOUNTER — Other Ambulatory Visit: Payer: Self-pay | Admitting: Allergy

## 2020-08-05 ENCOUNTER — Other Ambulatory Visit: Payer: Self-pay | Admitting: Allergy

## 2020-08-29 ENCOUNTER — Other Ambulatory Visit: Payer: Self-pay | Admitting: Nurse Practitioner

## 2020-09-15 ENCOUNTER — Ambulatory Visit: Payer: 59 | Admitting: Allergy

## 2020-09-17 ENCOUNTER — Ambulatory Visit: Payer: 59 | Admitting: Allergy

## 2020-09-21 ENCOUNTER — Ambulatory Visit (AMBULATORY_SURGERY_CENTER): Payer: 59 | Admitting: *Deleted

## 2020-09-21 ENCOUNTER — Other Ambulatory Visit: Payer: Self-pay

## 2020-09-21 VITALS — Ht 62.0 in | Wt 174.4 lb

## 2020-09-21 DIAGNOSIS — K219 Gastro-esophageal reflux disease without esophagitis: Secondary | ICD-10-CM

## 2020-09-21 NOTE — Progress Notes (Signed)
Pt verified name, DOB, address and insurance during PV today. Pt mailed instruction packet to included paper to complete and mail back to [facility] with addressed and stamped envelope, Emmi video, copy of consent form to read and not return, and instructions. Pt encouraged to call with questions or issues.  My Chart instructions to pt as well    No egg or soy allergy known to patient   issues with past sedation with any surgeries or procedures Of PONV that is severe- colon 2020 no N/V post colon in [place]  Patient denies ever being told they had issues or difficulty with intubation  No FH of Malignant Hyperthermia No diet pills per patient No home 02 use per patient  No blood thinners per patient  Pt denies issues with constipation  No A fib or A flutter  EMMI video to pt or via [place] 19 guidelines implemented in PV today with Pt and RN  Pt is fully vaccinated  for Covid   Due to the COVID-19 pandemic we are asking patients to follow certain guidelines.  Pt aware of COVID protocols and [facility] guidelines

## 2020-10-05 ENCOUNTER — Encounter: Payer: Self-pay | Admitting: Gastroenterology

## 2020-10-05 ENCOUNTER — Ambulatory Visit (AMBULATORY_SURGERY_CENTER): Payer: 59 | Admitting: Gastroenterology

## 2020-10-05 VITALS — BP 139/84 | HR 76 | Temp 97.7°F | Resp 22 | Ht 62.0 in | Wt 174.4 lb

## 2020-10-05 DIAGNOSIS — K449 Diaphragmatic hernia without obstruction or gangrene: Secondary | ICD-10-CM | POA: Diagnosis not present

## 2020-10-05 DIAGNOSIS — R1310 Dysphagia, unspecified: Secondary | ICD-10-CM

## 2020-10-05 DIAGNOSIS — K317 Polyp of stomach and duodenum: Secondary | ICD-10-CM

## 2020-10-05 DIAGNOSIS — K219 Gastro-esophageal reflux disease without esophagitis: Secondary | ICD-10-CM

## 2020-10-05 MED ORDER — SODIUM CHLORIDE 0.9 % IV SOLN
500.0000 mL | Freq: Once | INTRAVENOUS | Status: DC
Start: 2020-10-05 — End: 2020-10-05

## 2020-10-05 NOTE — Progress Notes (Signed)
Report given to PACU, vss 

## 2020-10-05 NOTE — Op Note (Signed)
[facility] Patient Name: Ann Valenzuela Procedure Date: 10/05/2020 10:22 AM MRN: [id number] Endoscopist: Remo Lipps P. Havery Moros , MD Age: 55 Referring MD:  Date of Birth: 04-09-1965 Gender: Female Account #: [id number] Procedure:                Upper GI endoscopy Indications:              Dysphagia, history of gastro-esophageal reflux                            disease - both improved on omeprazole '40mg'$  BID -                            symptoms relatively well controlled, dysphagia                            improved and quite mild Medicines:                Monitored Anesthesia Care Procedure:                Pre-Anesthesia Assessment:                           - Prior to the procedure, a History and Physical                            was performed, and patient medications and                            allergies were reviewed. The patient's tolerance of                            previous anesthesia was also reviewed. The risks                            and benefits of the procedure and the sedation                            options and risks were discussed with the patient.                            All questions were answered, and informed consent                            was obtained. Prior Anticoagulants: The patient has                            taken no previous anticoagulant or antiplatelet                            agents. ASA Grade Assessment: II - A patient with                            mild systemic disease. After reviewing the risks  and benefits, the patient was deemed in                            satisfactory condition to undergo the procedure.                           After obtaining informed consent, the endoscope was                            passed under direct vision. Throughout the                            procedure, the patient's blood pressure, pulse, and                            oxygen saturations were  monitored continuously. The                            Endoscope was introduced through the mouth, and                            advanced to the second part of duodenum. The upper                            GI endoscopy was accomplished without difficulty.                            The patient tolerated the procedure well. Scope In: Scope Out: Findings:                 Esophagogastric landmarks were identified: the                            Z-line was found at 39 cm, the gastroesophageal                            junction was found at 39 cm and the upper extent of                            the gastric folds was found at 40 cm from the                            incisors.                           A 1 cm hiatal hernia was present.                           The Z-line was slighty irregular with a diminutive                            tongue a few mm in size, did not meet criteria for  Barrett's biopsies.                           The exam of the esophagus was otherwise normal. No                            obvious stenosis / stricture noted.                           Multiple small sessile polyps were found in the                            gastric fundus and in the gastric body, grossly                            consistent with benign fundic gland polyps. One                            representative polyp was removed with a cold biopsy                            forceps. Resection and retrieval were complete.                           The exam of the stomach was otherwise normal.                           The duodenal bulb and second portion of the                            duodenum were normal. Complications:            No immediate complications. Estimated blood loss:                            Minimal. Estimated Blood Loss:     Estimated blood loss was minimal. Impression:               - Esophagogastric landmarks identified.                            - 1 cm hiatal hernia.                           - Z-line irregular - as above, did not meet                            criteria for Barrett's biopsies.                           - Normal esophagus otherwise - no stenosis /                            stricture appreciated, dilation not performed given  symptoms of dysphagia are minimal and improved                           - Multiple gastric polyps. Representative sample                            resected and retrieved - suspect benign fundic                            gland polyp.                           - Normal stomach otherwise                           - Normal duodenal bulb and second portion of the                            duodenum. Recommendation:           - Patient has a contact number available for                            emergencies. The signs and symptoms of potential                            delayed complications were discussed with the                            patient. Return to normal activities tomorrow.                            Written discharge instructions were provided to the                            patient.                           - Resume previous diet.                           - Continue present medications. Long term use                            lowest dose of PPI needed to control symptoms, if                            BID dosing is needed that is okay. If symptoms                            bothersome despite twice daily dosing of omeprazole                            consider switch in regimen                           -  Await pathology results. Remo Lipps P. Elijah Phommachanh, MD 10/05/2020 10:37:59 AM This report has been signed electronically.

## 2020-10-05 NOTE — Progress Notes (Signed)
VS by CW  Pt's states no medical or surgical changes since previsit or office visit.  

## 2020-10-05 NOTE — Patient Instructions (Signed)
YOU HAD AN ENDOSCOPIC PROCEDURE TODAY AT [facility]:   Refer to the procedure report that was given to you for any specific questions about what was found during the examination.  If the procedure report does not answer your questions, please call your gastroenterologist to clarify.  If you requested that your care partner not be given the details of your procedure findings, then the procedure report has been included in a sealed envelope for you to review at your convenience later.  YOU SHOULD EXPECT: Some feelings of bloating in the abdomen. Passage of more gas than usual.  Walking can help get rid of the air that was put into your GI tract during the procedure and reduce the bloating. If you had a lower endoscopy (such as a colonoscopy or flexible sigmoidoscopy) you may notice spotting of blood in your stool or on the toilet paper. If you underwent a bowel prep for your procedure, you may not have a normal bowel movement for a few days.  Please Note:  You might notice some irritation and congestion in your nose or some drainage.  This is from the oxygen used during your procedure.  There is no need for concern and it should clear up in a day or so.  SYMPTOMS TO REPORT IMMEDIATELY:    Following upper endoscopy (EGD)  Vomiting of blood or coffee ground material  New chest pain or pain under the shoulder blades  Painful or persistently difficult swallowing  New shortness of breath  Fever of 100F or higher  Black, tarry-looking stools  For urgent or emergent issues, a gastroenterologist can be reached at any hour by calling [phone]. Do not use MyChart messaging for urgent concerns.    DIET:  We do recommend a small meal at first, but then you may proceed to your regular diet.  Drink plenty of fluids but you should avoid alcoholic beverages for 24 hours.  ACTIVITY:  You should plan to take it easy for the rest of today and you should NOT DRIVE or use heavy machinery  until tomorrow (because of the sedation medicines used during the test).    FOLLOW UP: Our staff will call the number listed on your records 48-72 hours following your procedure to check on you and address any questions or concerns that you may have regarding the information given to you following your procedure. If we do not reach you, we will leave a message.  We will attempt to reach you two times.  During this call, we will ask if you have developed any symptoms of COVID 19. If you develop any symptoms (ie: fever, flu-like symptoms, shortness of breath, cough etc.) before then, please call [phone].  If you test positive for Covid 19 in the 2 weeks post procedure, please call and report this information to [place].    If any biopsies were taken you will be contacted by phone or by letter within the next 1-3 weeks.  Please call us at [phone] if you have not heard about the biopsies in 3 weeks.    SIGNATURES/CONFIDENTIALITY: You and/or your care partner have signed paperwork which will be entered into your electronic medical record.  These signatures attest to the fact that that the information above on your After Visit Summary has been reviewed and is understood.  Full responsibility of the confidentiality of this discharge information lies with you and/or your care-partner.    Resume medications. Information given on Hiatal Hernia.

## 2020-10-05 NOTE — Progress Notes (Signed)
[facility] History and Physical   Primary Care Physician:  Lennie Odor, [place]   Reason for Procedure:   GERD, dysphagia  Plan:    EGD with possible dilation     HPI: Ann Valenzuela is a 55 y.o. female history of GERD, poorly controlled on once daily omeprazole and pepcid, now on BID omeprazole with some improvement, has some rare breaktrhrough. Occasional dysphagia but better with treatment of reflux recently, very mild. No prior EGD. Otherwise feeling well without complaints.   Past Medical History:  Diagnosis Date   Allergy    Asthma    Diabetes mellitus without complication (HCC)    Family history of adverse reaction to anesthesia    father aspirated after surgery due to severe reflux   GERD (gastroesophageal reflux disease)    History of kidney stones    Hyperlipidemia    controlled on meds   Hypertension    PONV (postoperative nausea and vomiting)    severe PONV   Sleep apnea    wears cpap   Uncomplicated asthma [id number]    Past Surgical History:  Procedure Laterality Date   ABDOMINAL HYSTERECTOMY     BREAST LUMPECTOMY WITH RADIOACTIVE SEED LOCALIZATION Left 05/10/2020   Procedure: LEFT BREAST LUMPECTOMY WITH RADIOACTIVE SEED LOCALIZATION;  Surgeon: Jovita Kussmaul, MD;  Location: [facility];  Service: General;  Laterality: Left;   BREAST SURGERY     right breast biopsy, cyst removal   COLONOSCOPY  2020   10 yr recall   kidney stone removal      Prior to Admission medications   Medication Sig Start Date End Date Taking? Authorizing Provider  albuterol (VENTOLIN HFA) 108 (90 Base) MCG/ACT inhaler INHALE 2 PUFFS BY MOUTH EVERY 4 TO 6 HOURS AS NEEDED 07/27/20  Yes Garnet Sierras, DO  Ascorbic Acid (VITAMIN C) 1000 MG tablet Take 1,000 mg by mouth daily.   Yes [provider]  atorvastatin (LIPITOR) 10 MG tablet Take 10 mg by mouth daily. 10/13/19  Yes [provider]  Azelastine HCl 137 MCG/SPRAY SOLN 1 puff in each  nostril   Yes [provider]  fenofibrate (TRICOR) 145 MG tablet Take 145 mg by mouth daily. Takes at bedtime 10/20/19  Yes [provider]  FIBER PO Take 5 capsules by mouth daily.   Yes [provider]  fluticasone (FLONASE) 50 MCG/ACT nasal spray Place into both nostrils daily.   Yes [provider]  HI POTENCY B COMPLEX/C PO Take 1 tablet by mouth daily.   Yes [provider]  loratadine (CLARITIN) 10 MG tablet Take 10 mg by mouth daily.   Yes [provider]  losartan (COZAAR) 25 MG tablet Take 25 mg by mouth daily. 10/23/19  Yes [provider]  Magnesium 250 MG TABS 1 tablet with a meal   Yes [provider]  metFORMIN (GLUCOPHAGE) 500 MG tablet Take 500 mg by mouth 2 (two) times daily. 01/26/20  Yes [provider]  Misc Natural Products (GLUCOSAMINE CHOND COMPLEX/MSM) TABS See admin instructions. 10/08/19  Yes [provider]  montelukast (SINGULAIR) 10 MG tablet TAKE 1 TABLET(10 MG) BY MOUTH AT BEDTIME 08/05/20  Yes Garnet Sierras, DO  Multiple Vitamin (MULTIVITAMIN) tablet Take 1 tablet by mouth daily.   Yes [provider]  omeprazole (PRILOSEC) 40 MG capsule TAKE 1 CAPSULE(40 MG) BY MOUTH IN THE MORNING AND AT BEDTIME 08/30/20  Yes Noralyn Pick, NP  Zinc Sulfate (ZINC 15 PO) Take  30 mg by mouth.   Yes [provider]  Cholecalciferol (VITAMIN D) 50 MCG (2000 UT) tablet 1 tablet Patient not taking: No sig reported    [provider]  ciclesonide (ALVESCO) 160 MCG/ACT inhaler 1 puff Patient not taking: No sig reported    [provider]  famotidine (PEPCID) 20 MG tablet Take 20 mg by mouth 2 (two) times daily. Patient not taking: No sig reported    [provider]  levocetirizine (XYZAL) 5 MG tablet 1 tablet in the evening    [provider]    Current Outpatient Medications  Medication Sig Dispense Refill   albuterol (VENTOLIN HFA) 108  (90 Base) MCG/ACT inhaler INHALE 2 PUFFS BY MOUTH EVERY 4 TO 6 HOURS AS NEEDED 6.7 g 1   Ascorbic Acid (VITAMIN C) 1000 MG tablet Take 1,000 mg by mouth daily.     atorvastatin (LIPITOR) 10 MG tablet Take 10 mg by mouth daily.     Azelastine HCl 137 MCG/SPRAY SOLN 1 puff in each nostril     fenofibrate (TRICOR) 145 MG tablet Take 145 mg by mouth daily. Takes at bedtime     FIBER PO Take 5 capsules by mouth daily.     fluticasone (FLONASE) 50 MCG/ACT nasal spray Place into both nostrils daily.     HI POTENCY B COMPLEX/C PO Take 1 tablet by mouth daily.     loratadine (CLARITIN) 10 MG tablet Take 10 mg by mouth daily.     losartan (COZAAR) 25 MG tablet Take 25 mg by mouth daily.     Magnesium 250 MG TABS 1 tablet with a meal     metFORMIN (GLUCOPHAGE) 500 MG tablet Take 500 mg by mouth 2 (two) times daily.     Misc Natural Products (GLUCOSAMINE CHOND COMPLEX/MSM) TABS See admin instructions.     montelukast (SINGULAIR) 10 MG tablet TAKE 1 TABLET(10 MG) BY MOUTH AT BEDTIME 90 tablet 0   Multiple Vitamin (MULTIVITAMIN) tablet Take 1 tablet by mouth daily.     omeprazole (PRILOSEC) 40 MG capsule TAKE 1 CAPSULE(40 MG) BY MOUTH IN THE MORNING AND AT BEDTIME 60 capsule 1   Zinc Sulfate (ZINC 15 PO) Take 30 mg by mouth.     Cholecalciferol (VITAMIN D) 50 MCG (2000 UT) tablet 1 tablet (Patient not taking: No sig reported)     ciclesonide (ALVESCO) 160 MCG/ACT inhaler 1 puff (Patient not taking: No sig reported)     famotidine (PEPCID) 20 MG tablet Take 20 mg by mouth 2 (two) times daily. (Patient not taking: No sig reported)     levocetirizine (XYZAL) 5 MG tablet 1 tablet in the evening     Current Facility-Administered Medications  Medication Dose Route Frequency Provider Last Rate Last Admin   0.9 %  sodium chloride infusion  500 mL Intravenous Once Jaber Dunlow, Carlota Raspberry, MD        Allergies as of 10/05/2020 - Review Complete 10/05/2020  Allergen Reaction Noted   Hydrochlorothiazide Cough  05/10/2020   Lisinopril Cough 05/03/2020   Other Other (See Comments) 01/20/2020    Family History  Problem Relation Age of Onset   Asthma Mother    Allergic rhinitis Father    Allergic rhinitis Sister    Asthma Sister    Breast cancer Maternal Aunt        dx after 50   Ovarian cancer Maternal Aunt 69   Pancreatic cancer Paternal Aunt    Leukemia Cousin 25  maternal cousin   Colon cancer Cousin 19       maternal cousin; metastatic    Colon cancer Other        MGF's sister; dx late 75s   Bone cancer Other        distant paternal cousin    Colon polyps Neg Hx    Esophageal cancer Neg Hx    Stomach cancer Neg Hx    Rectal cancer Neg Hx     Social History   Socioeconomic History   Marital status: Married    Spouse name: Not on file   Number of children: Not on file   Years of education: Not on file   Highest education level: Not on file  Occupational History   Not on file  Tobacco Use   Smoking status: Never   Smokeless tobacco: Never  Vaping Use   Vaping Use: Never used  Substance and Sexual Activity   Alcohol use: Yes    Comment: occ   Drug use: Never   Sexual activity: Not on file  Other Topics Concern   Not on file  Social History Narrative   Not on file   Social Determinants of Health   Financial Resource Strain: Not on file  Food Insecurity: Not on file  Transportation Needs: Not on file  Physical Activity: Not on file  Stress: Not on file  Social Connections: Not on file  Intimate Partner Violence: Not on file    Review of Systems: All other review of systems negative except as mentioned in the HPI.  Physical Exam: Vital signs BP 130/84   Pulse 83   Temp 97.7 F (36.5 C)   Ht '5\' 2"'$  (1.575 m)   Wt 174 lb 6.4 oz (79.1 kg)   SpO2 98%   BMI 31.90 kg/m   General:   Alert,  Well-developed, well-nourished, pleasant and cooperative in NAD Lungs:  Clear throughout to auscultation.   Heart:  Regular rate and rhythm;  Abdomen:  Soft,  nontender and nondistended. Normal bowel sounds.   Neuro/Psych:  Alert and cooperative. Normal mood and affect. A and O x 3  Jolly Mango, MD [facility]

## 2020-10-05 NOTE — Progress Notes (Signed)
Called to room to assist during endoscopic procedure.  Patient ID and intended procedure confirmed with present staff. Received instructions for my participation in the procedure from the performing physician.  

## 2020-10-07 ENCOUNTER — Telehealth: Payer: Self-pay

## 2020-10-07 NOTE — Telephone Encounter (Signed)
Left message on answering machine. 

## 2020-10-11 ENCOUNTER — Other Ambulatory Visit: Payer: Self-pay

## 2020-10-11 ENCOUNTER — Encounter: Payer: Self-pay | Admitting: Allergy

## 2020-10-11 ENCOUNTER — Ambulatory Visit: Payer: 59 | Admitting: Allergy

## 2020-10-11 VITALS — BP 130/82 | HR 74 | Temp 98.7°F | Resp 18 | Ht 62.0 in | Wt 177.1 lb

## 2020-10-11 DIAGNOSIS — J4520 Mild intermittent asthma, uncomplicated: Secondary | ICD-10-CM

## 2020-10-11 DIAGNOSIS — J3089 Other allergic rhinitis: Secondary | ICD-10-CM

## 2020-10-11 MED ORDER — MONTELUKAST SODIUM 10 MG PO TABS: 10.0000 mg | ORAL_TABLET | Freq: Every day | ORAL | 2 refills | Status: DC

## 2020-10-11 MED ORDER — FLUTICASONE PROPIONATE HFA 110 MCG/ACT IN AERO: 2.0000 | INHALATION_SPRAY | Freq: Two times a day (BID) | RESPIRATORY_TRACT | 5 refills | Status: DC

## 2020-10-11 NOTE — Progress Notes (Signed)
Follow Up Note  RE: Ann Valenzuela MRN: [id number] DOB: November 10, 1965 Date of Office Visit: 10/11/2020  Referring provider: Lennie Odor, PA Primary care provider: Lennie Odor, PA  Chief Complaint: Follow-up  History of Present Illness: I had the pleasure of seeing Ann Valenzuela for a follow up visit at the [facility] [place] [facility] [place] on 10/11/2020. She is a 55 y.o. female, who is being followed for asthma and allergic rhinoconjunctivitis. Her previous allergy office visit was on 03/17/2020 with Dr. Maudie Mercury. Today is a regular follow up visit.  Mild intermittent asthma Used albuterol occasionally during the summer with good benefit. Ragweed also seems to trigger symptoms.   In March, patient had to use Alvesco 172mg 2 puffs twice a day for 3 weeks with good benefit. Insurance will not cover Alvesco.  Denies any ER/urgent care visits or prednisone use since the last visit.   Allergic rhino conjunctivitis Some itchy eyes and stinging on the face when outdoors walking.  Currently taking Claritin daily and Singulair daily with good benefit. Used Flonase as needed with good benefit.   Had EGD done last week for gerd/coughing - she didn't hear back with the results yet.   Assessment and Plan: [name] a 55y.o. female with: Mild intermittent asthma Symptoms flared in March and had to use Alvesco for a few weeks with good benefit.  Ragweed also seems to trigger symptoms.  No ER/urgent care or prednisone use since her last visit. Today's spirometry was normal. Daily controller medication(s): none. During upper respiratory infections/asthma flares: start Flovent 1172m 2 puffs twice a day for 1-2 weeks until your breathing symptoms return to baseline.  May need to take it daily in March and during the fall during ragweed season. Let me know if too expensive - Alvesco was not covered. May use albuterol rescue inhaler 2 puffs every 4 to 6 hours as needed for shortness of  breath, chest tightness, coughing, and wheezing. May use albuterol rescue inhaler 2 puffs 5 to 15 minutes prior to strenuous physical activities. Monitor frequency of use.  Get spirometry at next visit.  Other allergic rhinitis Past history - Rhinoconjunctivitis symptoms for 20+ years mainly in the spring and fall. Last skin testing was over 7 years ago which showed multiple positives per patient report.  [place]. Records scanned. Wears CPAP at night.  Interim history - itchy eyes/face when outdoors.  Continue environmental control measures as below. Use over the counter antihistamines such as Zyrtec (cetirizine), Claritin (loratadine), Allegra (fexofenadine), or Xyzal (levocetirizine) daily as needed. May take twice a day during allergy flares. May switch antihistamines every few months. Continue with montelukast (Singulair) '10mg'$  daily.  Use Flonase (fluticasone) nasal spray 1 spray per nostril twice a day as needed for nasal congestion.  Use azelastine nasal spray 1-2 sprays per nostril twice a day as needed for runny nose/drainage. Nasal saline spray (i.e., Simply Saline) or nasal saline lavage (i.e., NeilMed) is recommended as needed and prior to medicated nasal sprays. Monitor symptoms - if they get worse will retest later.   Return in about 6 months (around 04/13/2021).  Meds ordered this encounter  Medications   fluticasone (FLOVENT HFA) 110 MCG/ACT inhaler    Sig: Inhale 2 puffs into the lungs in the morning and at bedtime. with spacer and rinse mouth afterwards.    Dispense:  1 each    Refill:  5   montelukast (SINGULAIR) 10 MG tablet  Sig: Take 1 tablet (10 mg total) by mouth at bedtime.    Dispense:  90 tablet    Refill:  2    Lab Orders  No laboratory test(s) ordered today    Diagnostics: Spirometry:  Tracings reviewed. Her effort: Good reproducible efforts. FVC: 2.56L FEV1: 1.90L, 76% predicted FEV1/FVC  ratio: 74% Interpretation: Spirometry consistent with normal pattern.  Please see scanned spirometry results for details.  Medication List:  Current Outpatient Medications  Medication Sig Dispense Refill   albuterol (VENTOLIN HFA) 108 (90 Base) MCG/ACT inhaler INHALE 2 PUFFS BY MOUTH EVERY 4 TO 6 HOURS AS NEEDED 6.7 g 1   Ascorbic Acid (VITAMIN C) 1000 MG tablet Take 1,000 mg by mouth daily.     atorvastatin (LIPITOR) 10 MG tablet Take 10 mg by mouth daily.     Azelastine HCl 137 MCG/SPRAY SOLN 1 puff in each nostril     Cholecalciferol (VITAMIN D) 50 MCG (2000 UT) tablet 1 tablet     famotidine (PEPCID) 20 MG tablet Take 20 mg by mouth 2 (two) times daily.     fenofibrate (TRICOR) 145 MG tablet Take 145 mg by mouth daily. Takes at bedtime     FIBER PO Take 5 capsules by mouth daily.     fluticasone (FLONASE) 50 MCG/ACT nasal spray Place into both nostrils daily.     fluticasone (FLOVENT HFA) 110 MCG/ACT inhaler Inhale 2 puffs into the lungs in the morning and at bedtime. with spacer and rinse mouth afterwards. 1 each 5   HI POTENCY B COMPLEX/C PO Take 1 tablet by mouth daily.     levocetirizine (XYZAL) 5 MG tablet 1 tablet in the evening     loratadine (CLARITIN) 10 MG tablet Take 10 mg by mouth daily.     losartan (COZAAR) 25 MG tablet Take 25 mg by mouth daily.     Magnesium 250 MG TABS 1 tablet with a meal     metFORMIN (GLUCOPHAGE) 500 MG tablet Take 500 mg by mouth 2 (two) times daily.     Misc Natural Products (GLUCOSAMINE CHOND COMPLEX/MSM) TABS See admin instructions.     montelukast (SINGULAIR) 10 MG tablet Take 1 tablet (10 mg total) by mouth at bedtime. 90 tablet 2   Multiple Vitamin (MULTIVITAMIN) tablet Take 1 tablet by mouth daily.     omeprazole (PRILOSEC) 40 MG capsule TAKE 1 CAPSULE(40 MG) BY MOUTH IN THE MORNING AND AT BEDTIME 60 capsule 1   Zinc Sulfate (ZINC 15 PO) Take 30 mg by mouth.     No current facility-administered medications for this visit.    Allergies: Allergies  Allergen Reactions   Hydrochlorothiazide Cough   Lisinopril Cough   Other Other (See Comments)    Seasonal allergies    I reviewed her past medical history, social history, family history, and environmental history and no significant changes have been reported from her previous visit.  Review of Systems  Constitutional:  Negative for appetite change, chills, fever and unexpected weight change.  HENT:  Negative for congestion and rhinorrhea.   Eyes:  Negative for itching.  Respiratory:  Negative for cough, chest tightness, shortness of breath and wheezing.   Cardiovascular:  Negative for chest pain.  Gastrointestinal:  Negative for abdominal pain.  Genitourinary:  Negative for difficulty urinating.  Skin:  Negative for rash.  Allergic/Immunologic: Positive for environmental allergies.  Neurological:  Negative for headaches.   Objective: BP 130/82   Pulse 74   Temp 98.7 F (37.1 C) (Temporal)  Resp 18   Ht '5\' 2"'$  (1.575 m)   Wt 177 lb 2 oz (80.3 kg)   SpO2 98%   BMI 32.40 kg/m  Body mass index is 32.4 kg/m. Physical Exam Vitals and nursing note reviewed.  Constitutional:      Appearance: Normal appearance. She is well-developed.  HENT:     Head: Normocephalic and atraumatic.     Right Ear: External ear normal.     Left Ear: External ear normal.     Nose: Nose normal.     Mouth/Throat:     Mouth: Mucous membranes are moist.     Pharynx: Oropharynx is clear.  Eyes:     Conjunctiva/sclera: Conjunctivae normal.  Cardiovascular:     Rate and Rhythm: Normal rate and regular rhythm.     Heart sounds: Normal heart sounds. No murmur heard.   No friction rub. No gallop.  Pulmonary:     Effort: Pulmonary effort is normal.     Breath sounds: Normal breath sounds. No wheezing, rhonchi or rales.  Abdominal:     Palpations: Abdomen is soft.  Musculoskeletal:     Cervical back: Neck supple.  Skin:    General: Skin is warm.     Findings: No rash.   Neurological:     Mental Status: She is alert and oriented to person, place, and time.  Psychiatric:        Behavior: Behavior normal.  Previous notes and tests were reviewed. The plan was reviewed with the patient/family, and all questions/concerned were addressed.  It was my pleasure to see Ann Valenzuela today and participate in her care. Please feel free to contact me with any questions or concerns.  Sincerely,  Rexene Alberts, DO Allergy & Immunology  [facility] office: [place] office: [phone]

## 2020-10-11 NOTE — Assessment & Plan Note (Signed)
Symptoms flared in March and had to use Alvesco for a few weeks with good benefit.  Ragweed also seems to trigger symptoms.  No ER/urgent care or prednisone use since her last visit.  Today's spirometry was normal. . Daily controller medication(s): none. . During upper respiratory infections/asthma flares: start Flovent 149mg 2 puffs twice a day for 1-2 weeks until your breathing symptoms return to baseline.  o May need to take it daily in March and during the fall during ragweed season. o Let me know if too expensive - Alvesco was not covered. . May use albuterol rescue inhaler 2 puffs every 4 to 6 hours as needed for shortness of breath, chest tightness, coughing, and wheezing. May use albuterol rescue inhaler 2 puffs 5 to 15 minutes prior to strenuous physical activities. Monitor frequency of use.  . Get spirometry at next visit.

## 2020-10-11 NOTE — Patient Instructions (Addendum)
Environmental allergies Continue environmental control measures as below. Use over the counter antihistamines such as Zyrtec (cetirizine), Claritin (loratadine), Allegra (fexofenadine), or Xyzal (levocetirizine) daily as needed. May take twice a day during allergy flares. May switch antihistamines every few months.  Continue with montelukast (singulair) '10mg'$  daily.  Use Flonase (fluticasone) nasal spray 1 spray per nostril twice a day as needed for nasal congestion.  Use azelastine nasal spray 1-2 sprays per nostril twice a day as needed for runny nose/drainage. Nasal saline spray (i.e., Simply Saline) or nasal saline lavage (i.e., NeilMed) is recommended as needed and prior to medicated nasal sprays. Monitor symptoms - if they get worse will retest later.   Asthma: Normal breathing test today.  Daily controller medication(s): none. During upper respiratory infections/asthma flares: start Flovent 110mg 2 puffs twice a day for 1-2 weeks until your breathing symptoms return to baseline.  May need to take it daily in March and during the fall during ragweed season. Let me know if too expensive.  May use albuterol rescue inhaler 2 puffs every 4 to 6 hours as needed for shortness of breath, chest tightness, coughing, and wheezing. May use albuterol rescue inhaler 2 puffs 5 to 15 minutes prior to strenuous physical activities. Monitor frequency of use.  Asthma control goals:  Full participation in all desired activities (may need albuterol before activity) Albuterol use two times or less a week on average (not counting use with activity) Cough interfering with sleep two times or less a month Oral steroids no more than once a year No hospitalizations  Follow up in 6 months or sooner if needed.   Reducing Pollen Exposure Pollen seasons: trees (spring), grass (summer) and ragweed/weeds (fall). Keep windows closed in your home and car to lower pollen exposure.  Install air conditioning in the  bedroom and throughout the house if possible.  Avoid going out in dry windy days - especially early morning. Pollen counts are highest between 5 - 10 AM and on dry, hot and windy days.  Save outside activities for late afternoon or after a heavy rain, when pollen levels are lower.  Avoid mowing of grass if you have grass pollen allergy. Be aware that pollen can also be transported indoors on people and pets.  Dry your clothes in an automatic dryer rather than hanging them outside where they might collect pollen.  Rinse hair and eyes before bedtime.

## 2020-10-11 NOTE — Assessment & Plan Note (Signed)
Past history - Rhinoconjunctivitis symptoms for 20+ years mainly in the spring and fall. Last skin testing was over 7 years ago which showed multiple positives per patient report.  Allergy injections in [place] from 2007-2010 with unknown benefit. Records scanned. Wears CPAP at night.  Interim history - itchy eyes/face when outdoors.   Continue environmental control measures as below.  Use over the counter antihistamines such as Zyrtec (cetirizine), Claritin (loratadine), Allegra (fexofenadine), or Xyzal (levocetirizine) daily as needed. May take twice a day during allergy flares. May switch antihistamines every few months.  Continue with montelukast (Singulair) '10mg'$  daily.  . Use Flonase (fluticasone) nasal spray 1 spray per nostril twice a day as needed for nasal congestion.   Use azelastine nasal spray 1-2 sprays per nostril twice a day as needed for runny nose/drainage.  Nasal saline spray (i.e., Simply Saline) or nasal saline lavage (i.e., NeilMed) is recommended as needed and prior to medicated nasal sprays.  Monitor symptoms - if they get worse will retest later.

## 2020-10-25 ENCOUNTER — Other Ambulatory Visit: Payer: Self-pay | Admitting: Nurse Practitioner

## 2020-11-29 ENCOUNTER — Other Ambulatory Visit: Payer: Self-pay

## 2020-11-29 MED ORDER — AZELASTINE HCL 0.1 % NA SOLN: NASAL | 5 refills | Status: DC

## 2020-11-29 NOTE — Telephone Encounter (Signed)
Patient called for a refill for azelastine nasal spray refill. Patient is not due back for an office visit 04/13/2021.

## 2020-12-23 ENCOUNTER — Other Ambulatory Visit: Payer: Self-pay | Admitting: Nurse Practitioner

## 2021-02-01 ENCOUNTER — Other Ambulatory Visit: Payer: Self-pay | Admitting: Nurse Practitioner

## 2021-03-07 ENCOUNTER — Telehealth: Payer: Self-pay | Admitting: Allergy

## 2021-03-07 NOTE — Telephone Encounter (Signed)
Noted  

## 2021-03-07 NOTE — Telephone Encounter (Signed)
Patient called to let Dr. Maudie Mercury know she tested positive for COVID 02-19-2021. Patient states she is doing well now and just wanted Dr. Maudie Mercury to make a note of it in her chart - just in case it is something that needs to be addressed at patient's next visit.

## 2021-03-14 ENCOUNTER — Other Ambulatory Visit: Payer: Self-pay | Admitting: Nurse Practitioner

## 2021-04-21 NOTE — Patient Instructions (Signed)
Asthma  ?Begin Flovent 110-2 puffs twice a day with a spacer to prevent cough or wheeze ?Continue montelukast 10 mg once a day to prevent cough or wheeze ?Continue albuterol 2 puffs once every 4 hours as needed for cough or wheeze  ?You may use albuterol 2 puffs 5 to 15 minutes before activity to decrease cough or wheeze ? ?Allergic rhinitis ?Continue Claritin 10 mg once a day as needed for runny nose or itch .Remember to rotate to a different antihistamine about every 3 months. Some examples of over the counter antihistamines include Zyrtec (cetirizine), Xyzal (levocetirizine), Allegra (fexofenadine), and Claritin (loratidine).  ?Continue azelastine 2 sprays in each nostril up to twice a day as needed for runny nose ?Continue Flonase 2 sprays in each nostril once a day as needed for stuffy nose. In the right nostril, point the applicator out toward the right ear. In the left nostril, point the applicator out toward the left ear ?Consider saline nasal rinses as needed for nasal symptoms. Use this before any medicated nasal sprays for best result ?Consider updating your environmental allergy testing.  Remember to stop antihistamines for 3 days prior to the testing appointment. ? ?Allergic conjunctivitis ?Some over the counter eye drops include Pataday one drop in each eye once a day as needed for red, itchy eyes OR Zaditor one drop in each eye twice a day as needed for red itchy eyes. ? ?Reflux ?Continue dietary lifestyle modifications as listed below ?Continue to follow-up with your gastroenterologist as recommended ? ?Call the clinic if this treatment plan is not working well for you. ? ?Follow up in 2 months or sooner if needed. ?

## 2021-04-21 NOTE — Progress Notes (Signed)
? ?[place] [id number] ?Dept: [phone] ? ?FOLLOW UP NOTE ? ?Patient ID: Ann Valenzuela, female    DOB: September 23, 1965  Age: 56 y.o. MRN: [id number] ?Date of Office Visit: 04/22/2021 ? ?Assessment  ?Chief Complaint: Asthma (6 mth f/u - flaring here and there due to pollen starting earlier than normal; otherwise pretty stable) ? ?HPI ?Ann Valenzuela is a 56 year old female who presents the clinic for follow up visit.  She was last seen in this clinic on 10/11/2020 by Dr. Maudie Mercury for evaluation of asthma, allergic rhinitis, allergic conjunctivitis, and possible food allergy/intolerance to tree nuts.  In the interim, she was diagnosed with COVID on 02/19/2021 for which she took Paxlovid with resolution of symtpoms.  At today's visit, she reports her asthma has been moderately well controlled with symptoms including shortness of breath with activity and occasional cough with activity and with pollen exposure.  She denies shortness of breath and wheeze with moderate activity or rest.  She continues montelukast 10 mg once a day and uses albuterol before exercise and infrequently uses albuterol for rescue.  She is not currently using Flovent 110.  Allergic rhinitis is reported as moderately well controlled with occasional symptoms including clear rhinorrhea, nasal congestion, sneezing, and copious postnasal drainage with frequent throat clearing.  She continues an antihistamine once a day as needed for runny nose or itch.  She has been going back and forth between Claritin and Allegra with success.  She continues Flonase and azelastine daily and is not currently using a nasal saline rinse. Her most recent skin testing is reported to be about 10 years ago and positive to multiple pollens for which she received allergen immunotherapy from 2007-2010 while living in [place]. Allergic conjunctivitis is reported as moderately well controlled with red and itchy eyes occurring occasionally for which she  uses olopatadine with relief of symptoms.  Reflux is reported as moderately well controlled with heartburn occurring if she eats late or eats certain foods.  She continues omeprazole twice a day.  She does report that she works later in the evening and continues to eat after work.  She continues to follow Dr. Havery Moros at  [facility].  Her current medications are listed in the chart. ? ?Drug Allergies:  ?Allergies  ?Allergen Reactions  ? Hydrochlorothiazide Cough  ? Lisinopril Cough  ? Other Other (See Comments)  ?  Seasonal allergies   ? ? ?Physical Exam: ?BP 140/90   Pulse 88   Temp 97.7 ?F (36.5 ?C)   Resp 16   Ht 5\' 2"  (1.575 m)   Wt 177 lb 6.4 oz (80.5 kg)   SpO2 97%   BMI 32.45 kg/m?   ? ?Physical Exam ?Vitals reviewed.  ?Constitutional:   ?   Appearance: Normal appearance.  ?HENT:  ?   Head: Normocephalic and atraumatic.  ?   Right Ear: Tympanic membrane normal.  ?   Left Ear: Tympanic membrane normal.  ?   Nose:  ?   Comments: Bilateral naris slightly erythematous with clear nasal drainage noted.  Pharynx slightly erythematous with no exudate ears normal.  Eyes normal. ?Eyes:  ?   Conjunctiva/sclera: Conjunctivae normal.  ?Cardiovascular:  ?   Rate and Rhythm: Normal rate and regular rhythm.  ?   Heart sounds: Normal heart sounds. No murmur heard. ?Pulmonary:  ?   Effort: Pulmonary effort is normal.  ?   Breath sounds: Normal breath sounds.  ?   Comments: Lungs clear to auscultation ?Musculoskeletal:     ?  General: Normal range of motion.  ?   Cervical back: Normal range of motion and neck supple.  ?Skin: ?   General: Skin is warm and dry.  ?Neurological:  ?   Mental Status: She is alert and oriented to person, place, and time.  ?Psychiatric:     ?   Mood and Affect: Mood normal.     ?   Behavior: Behavior normal.     ?   Thought Content: Thought content normal.     ?   Judgment: Judgment normal.  ? ? ?Diagnostics: ?FVC 2.39, FEV1 1.78.  Predicted FVC 3.09, predicted FEV1 2.47.   Spirometry indicates normal ventilatory function. ? ?Assessment and Plan: ?1. Not well controlled moderate persistent asthma   ?2. Other allergic rhinitis   ?3. Allergic conjunctivitis of both eyes   ?4. Gastroesophageal reflux disease, unspecified whether esophagitis present   ? ? ?Meds ordered this encounter  ?Medications  ? albuterol (VENTOLIN HFA) 108 (90 Base) MCG/ACT inhaler  ?  Sig: INHALE 2 PUFFS BY MOUTH EVERY 4 TO 6 HOURS AS NEEDED  ?  Dispense:  6.7 g  ?  Refill:  3  ? fluticasone (FLOVENT HFA) 110 MCG/ACT inhaler  ?  Sig: Inhale 2 puffs into the lungs in the morning and at bedtime. with spacer and rinse mouth afterwards.  ?  Dispense:  1 each  ?  Refill:  5  ? montelukast (SINGULAIR) 10 MG tablet  ?  Sig: Take 1 tablet (10 mg total) by mouth at bedtime.  ?  Dispense:  90 tablet  ?  Refill:  2  ? ? ?Patient Instructions  ?Asthma  ?Begin Flovent 110-2 puffs twice a day with a spacer to prevent cough or wheeze ?Continue montelukast 10 mg once a day to prevent cough or wheeze ?Continue albuterol 2 puffs once every 4 hours as needed for cough or wheeze  ?You may use albuterol 2 puffs 5 to 15 minutes before activity to decrease cough or wheeze ? ?Allergic rhinitis ?Continue Claritin 10 mg once a day as needed for runny nose or itch .Remember to rotate to a different antihistamine about every 3 months. Some examples of over the counter antihistamines include Zyrtec (cetirizine), Xyzal (levocetirizine), Allegra (fexofenadine), and Claritin (loratidine).  ?Continue azelastine 2 sprays in each nostril up to twice a day as needed for runny nose ?Continue Flonase 2 sprays in each nostril once a day as needed for stuffy nose. In the right nostril, point the applicator out toward the right ear. In the left nostril, point the applicator out toward the left ear ?Consider saline nasal rinses as needed for nasal symptoms. Use this before any medicated nasal sprays for best result ?Consider updating your environmental  allergy testing.  Remember to stop antihistamines for 3 days prior to the testing appointment. ? ?Allergic conjunctivitis ?Some over the counter eye drops include Pataday one drop in each eye once a day as needed for red, itchy eyes OR Zaditor one drop in each eye twice a day as needed for red itchy eyes. ? ?Reflux ?Continue dietary lifestyle modifications as listed below ?Continue to follow-up with your gastroenterologist as recommended ? ?Call the clinic if this treatment plan is not working well for you. ? ?Follow up in 2 months or sooner if needed. ? ?Return in about 2 months (around 06/22/2021), or if symptoms worsen or fail to improve. ?  ? ?Thank you for the opportunity to care for this patient.  Please do not hesitate  to contact me with questions. ? ?Gareth Morgan, FNP ?[facility] [place] ? ? ? ? ? ?

## 2021-04-22 ENCOUNTER — Ambulatory Visit: Payer: 59 | Admitting: Family Medicine

## 2021-04-22 ENCOUNTER — Encounter: Payer: Self-pay | Admitting: Family Medicine

## 2021-04-22 ENCOUNTER — Other Ambulatory Visit: Payer: Self-pay

## 2021-04-22 VITALS — BP 140/90 | HR 88 | Temp 97.7°F | Resp 16 | Ht 62.0 in | Wt 177.4 lb

## 2021-04-22 DIAGNOSIS — K219 Gastro-esophageal reflux disease without esophagitis: Secondary | ICD-10-CM | POA: Diagnosis not present

## 2021-04-22 DIAGNOSIS — J3089 Other allergic rhinitis: Secondary | ICD-10-CM | POA: Diagnosis not present

## 2021-04-22 DIAGNOSIS — J4540 Moderate persistent asthma, uncomplicated: Secondary | ICD-10-CM | POA: Diagnosis not present

## 2021-04-22 DIAGNOSIS — H1013 Acute atopic conjunctivitis, bilateral: Secondary | ICD-10-CM | POA: Diagnosis not present

## 2021-04-22 MED ORDER — FLUTICASONE PROPIONATE HFA 110 MCG/ACT IN AERO
2.0000 | INHALATION_SPRAY | Freq: Two times a day (BID) | RESPIRATORY_TRACT | 5 refills | Status: DC
Start: 2021-04-22 — End: 2023-05-23

## 2021-04-22 MED ORDER — ALBUTEROL SULFATE HFA 108 (90 BASE) MCG/ACT IN AERS: INHALATION_SPRAY | RESPIRATORY_TRACT | 3 refills | Status: DC

## 2021-04-22 MED ORDER — MONTELUKAST SODIUM 10 MG PO TABS: 10.0000 mg | ORAL_TABLET | Freq: Every day | ORAL | 2 refills | Status: DC

## 2021-05-10 NOTE — Progress Notes (Signed)
? ?FOLLOW UP ?Date of Service/Encounter:  05/11/21 ? ? ?Subjective:  ?Ann Valenzuela (DOB: [birth year]) is a 56 y.o. female who returns to the [facility] on 05/11/2021 in re-evaluation of the following: asthma, allergic rhinitis, allergic conjunctivitis, and possible food allergy/intolerance to tree nuts ?History obtained from: chart review and patient. ? ?For Review, LV was on 04/22/21  with Gareth Morgan, FNP. FEV1 1.78L at last visit. ?Her asthma was flaring and she was started on Flovent 110,2 puffs BID.   ? ?Pertinent History/Diagnostics:  ?- Asthma: moved to [facility] in July 2021 from [place], prior to this was in [place] for a year and prior to that [place] for 10 years ?-When in [place], her asthma was not controlled and she was on ICS/LABA combination therapy.  Would flare during season changes and often require OCS multiple times per year.  No OCS in past 12 months ? - 02/19/21: COVID infection, treated with Paxlovid ?- Allergic Rhinitis:  ? - SPT environmental panel-years ago at previous allergist: patient reports multiple pollens as being positive ? - 2007-2010 on AIT in [place], 2016-2017 in [place] (moved to [place] the next year) ?- Reflux: followed by Dr. Havery Moros at [facility], taking omeprazole BID ?- Food intolerance ? - Hx of reaction: Pecans, walnuts cause oral dryness and throat dryness. ?Almonds sometimes causes the same reaction.  ?Cashews sometimes cause diarrhea.  ?These reactions only happens sometimes and not every time. ? - SPT select foods (11/05/19): negative to tree nuts ? ?Today presents for follow-up. ?Since last visit, she visited her son who has a shedding cat and dog, and when she returned to [place] pollen counts were higher than normal.  This is caused her asthma to become significantly uncontrolled. ?She is using albuterol nebulizer every 6 hours since last Thursday  (6 days ago). ?She does feel her symptoms are somewhat better-not wheezing every time she coughs,  but still has significant shortness of breath and coughing. ?In the past 12 months, she did not use OCS, but prior to this, she has needed multiple courses of steroids usually during season changes.  Spring and fall are her worst seasons.  She would like to avoid systemic steroid use at all costs.  She has dealt with significant weight issues in the past due to this. ?Additionally she notes that when using her albuterol, her blood pressure has been significantly more elevated. ?Her asthma had been doing well until she got COVID 19 infection in January. ?She has been on Advair while living in [place].  Believes she was also on Symbicort at one point. ? ?   ? ?Allergies as of 05/11/2021   ? ?   Reactions  ? Hydrochlorothiazide Cough  ? Lisinopril Cough  ? Other Other (See Comments)  ? Seasonal allergies  ?Other reaction(s): Unknown  ? ?  ? ?  ?Medication List  ?  ? ?  ? Accurate as of May 11, 2021 12:29 PM. If you have any questions, ask your nurse or doctor.  ?  ?  ? ?  ? ?STOP taking these medications   ? ?HI POTENCY B COMPLEX/C PO ?Stopped by: Sigurd Sos, MD ?  ?Zinc 20 MG Caps ?Stopped by: Sigurd Sos, MD ?  ? ?  ? ?TAKE these medications   ? ?albuterol 108 (90 Base) MCG/ACT inhaler ?Commonly known as: VENTOLIN HFA ?INHALE 2 PUFFS BY MOUTH EVERY 4 TO 6 HOURS AS NEEDED ?  ?atorvastatin 10 MG tablet ?Commonly known as: LIPITOR ?Take 10  mg by mouth daily. ?  ?Azelastine HCl 137 MCG/SPRAY Soln ?1 puff in each nostril ?  ?azelastine 0.1 % nasal spray ?Commonly known as: ASTELIN ?spray 1-2 sprays per nostril twice a day as needed for runny nose/drainage. ?  ?budesonide-formoterol 80-4.5 MCG/ACT inhaler ?Commonly known as: Symbicort ?Inhale 2 puffs into the lungs in the morning and at bedtime. ?Started by: Sigurd Sos, MD ?  ?fenofibrate 145 MG tablet ?Commonly known as: TRICOR ?Take 1 tablet by mouth daily. ?  ?fenofibrate 145 MG tablet ?Commonly known as: TRICOR ?Take 145 mg by mouth daily. Takes at bedtime ?   ?FIBER PO ?Take 5 capsules by mouth daily. ?  ?fluticasone 110 MCG/ACT inhaler ?Commonly known as: Flovent HFA ?Inhale 2 puffs into the lungs in the morning and at bedtime. with spacer and rinse mouth afterwards. ?  ?fluticasone 50 MCG/ACT nasal spray ?Commonly known as: FLONASE ?Place into both nostrils daily. ?  ?Glucosamine Chond Complex/MSM Tabs ?See admin instructions. ?  ?guaiFENesin 600 MG 12 hr tablet ?Commonly known as: [place] ?Take by mouth 2 (two) times daily. ?  ?ipratropium-albuterol 0.5-2.5 (3) MG/3ML Soln ?Commonly known as: DUONEB ?Take 3 mLs by nebulization. ?  ?ipratropium-albuterol 0.5-2.5 (3) MG/3ML Soln ?Commonly known as: DUONEB ?Take 1 mL by nebulization as needed. ?  ?levalbuterol 0.31 MG/3ML nebulizer solution ?Commonly known as: XOPENEX ?Take 3 mLs (0.31 mg total) by nebulization every 6 (six) hours as needed for wheezing or shortness of breath. ?Started by: Sigurd Sos, MD ?  ?levalbuterol 45 MCG/ACT inhaler ?Commonly known as: XOPENEX HFA ?Inhale 1-2 puffs into the lungs every 6 (six) hours as needed for wheezing or shortness of breath. ?Started by: Sigurd Sos, MD ?  ?levocetirizine 5 MG tablet ?Commonly known as: XYZAL ?1 tablet in the evening ?  ?loratadine 10 MG tablet ?Commonly known as: CLARITIN ?Take 10 mg by mouth daily. ?  ?losartan 25 MG tablet ?Commonly known as: COZAAR ?Take 25 mg by mouth daily. ?  ?Magnesium 250 MG Tabs ?1 tablet with a meal ?  ?metFORMIN 500 MG tablet ?Commonly known as: GLUCOPHAGE ?Take 500 mg by mouth 2 (two) times daily. ?  ?montelukast 10 MG tablet ?Commonly known as: Singulair ?Take 1 tablet (10 mg total) by mouth at bedtime. ?  ?multivitamin tablet ?Take 1 tablet by mouth daily. ?  ?Olopatadine HCl 0.2 % Soln ?1 drop into affected eye ?  ?omeprazole 40 MG capsule ?Commonly known as: PRILOSEC ?TAKE 1 CAPSULE(40 MG) BY MOUTH IN THE MORNING AND AT BEDTIME ?What changed: Another medication with the same name was removed. Continue taking this medication,  and follow the directions you see here. ?Changed by: Sigurd Sos, MD ?  ?Potassium 75 MG Tabs ?  ?Vitamin B Complex Tabs ?See admin instructions. ?  ?vitamin C 1000 MG tablet ?Take 1,000 mg by mouth daily. ?  ?Vitamin D 50 MCG (2000 UT) tablet ?1 tablet ?  ?ZINC 15 PO ?Take 30 mg by mouth. ?  ? ?  ? ?Past Medical History:  ?Diagnosis Date  ? Allergy   ? Asthma   ? Diabetes mellitus without complication ([place])   ? Family history of adverse reaction to anesthesia   ? father aspirated after surgery due to severe reflux  ? GERD (gastroesophageal reflux disease)   ? History of kidney stones   ? Hyperlipidemia   ? controlled on meds  ? Hypertension   ? PONV (postoperative nausea and vomiting)   ? severe PONV  ? Sleep apnea   ?  wears cpap  ? Uncomplicated asthma 01/56/1537  ? ?Past Surgical History:  ?Procedure Laterality Date  ? ABDOMINAL HYSTERECTOMY    ? BREAST LUMPECTOMY WITH RADIOACTIVE SEED LOCALIZATION Left 05/10/2020  ? Procedure: LEFT BREAST LUMPECTOMY WITH RADIOACTIVE SEED LOCALIZATION;  Surgeon: Jovita Kussmaul, MD;  Location: [facility];  Service: General;  Laterality: Left;  ? BREAST SURGERY    ? right breast biopsy, cyst removal  ? COLONOSCOPY  2020  ? 10 yr recall  ? kidney stone removal    ? ?Otherwise, there have been no changes to her past medical history, surgical history, family history, or social history. ? ?ROS: All others negative except as noted per HPI.  ? ?Objective:  ?BP (!) 162/98   Pulse 95   Temp 98.3 ?F (36.8 ?C) (Temporal)   Resp 14   SpO2 98%  ?There is no height or weight on file to calculate BMI. ?Physical Exam: ?General Appearance:  Alert, cooperative, no distress, appears stated age  ?Head:  Normocephalic, without obvious abnormality, atraumatic  ?Eyes:  Conjunctiva clear, EOM's intact  ?Nose: Nares normal, hypertrophic turbinates, normal mucosa, no visible anterior polyps, and septum midline  ?Throat: Lips, tongue normal; teeth and gums normal, normal posterior  oropharynx and no tonsillar exudate  ?Neck: Supple, symmetrical  ?Lungs:   Prolonged expiratory phase and clear to auscultation bilaterally, Respirations unlabored, intermittent dry coughing  ?Heart:  regular rate and

## 2021-05-11 ENCOUNTER — Other Ambulatory Visit: Payer: Self-pay

## 2021-05-11 ENCOUNTER — Encounter: Payer: Self-pay | Admitting: Internal Medicine

## 2021-05-11 ENCOUNTER — Ambulatory Visit: Payer: 59 | Admitting: Internal Medicine

## 2021-05-11 VITALS — BP 162/98 | HR 95 | Temp 98.3°F | Resp 14

## 2021-05-11 DIAGNOSIS — K219 Gastro-esophageal reflux disease without esophagitis: Secondary | ICD-10-CM | POA: Diagnosis not present

## 2021-05-11 DIAGNOSIS — H1013 Acute atopic conjunctivitis, bilateral: Secondary | ICD-10-CM | POA: Diagnosis not present

## 2021-05-11 DIAGNOSIS — J3089 Other allergic rhinitis: Secondary | ICD-10-CM | POA: Diagnosis not present

## 2021-05-11 DIAGNOSIS — J4540 Moderate persistent asthma, uncomplicated: Secondary | ICD-10-CM | POA: Diagnosis not present

## 2021-05-11 MED ORDER — LEVALBUTEROL TARTRATE 45 MCG/ACT IN AERO: 1.0000 | INHALATION_SPRAY | Freq: Four times a day (QID) | RESPIRATORY_TRACT | 12 refills | Status: DC | PRN

## 2021-05-11 MED ORDER — LEVALBUTEROL HCL 0.31 MG/3ML IN NEBU: 1.0000 | INHALATION_SOLUTION | Freq: Four times a day (QID) | RESPIRATORY_TRACT | 5 refills | Status: DC | PRN

## 2021-05-11 MED ORDER — BUDESONIDE-FORMOTEROL FUMARATE 80-4.5 MCG/ACT IN AERO: 2.0000 | INHALATION_SPRAY | Freq: Two times a day (BID) | RESPIRATORY_TRACT | 12 refills | Status: DC

## 2021-05-11 NOTE — Patient Instructions (Addendum)
Asthma - uncontrolled with current flare ? ?For Current and Future Flares:  ?Begin Symbicort 80, 2 puffs twice a day every day (this will be your new controller inhaler for now).  IN ADDITION TO  Flovent 110-2 puffs twice a day and continue for 2 weeks or until your symptoms resolve. ? ?Once Asthma controlled:  ?Use Symbicort 80, 2 puffs twice a day every day  ? ?Use a spacer with all inhalers. ?Prednisone pack provided-40 mg x 4 days, 20 mg on day 5; start if not seeing a significant improvement in symptoms in the next 2 to 3 days ? ?Continue montelukast 10 mg once a day to prevent cough or wheeze ?Start levalbuterol 2 puffs or 1 vial once every 4 - 6 hours as needed for cough or wheeze  ?You may use levalbuterol 2 puffs 5 to 15 minutes before activity to decrease cough or wheeze ? ?Allergic rhinitis ?- stable ?Continue Claritin 10 mg once a day as needed for runny nose or itch .Remember to rotate to a different antihistamine about every 3 months. Some examples of over the counter antihistamines include Zyrtec (cetirizine), Xyzal (levocetirizine), Allegra (fexofenadine), and Claritin (loratidine).  ?Continue azelastine 2 sprays in each nostril up to twice a day as needed for runny nose ?Continue Flonase 2 sprays in each nostril once a day as needed for stuffy nose. In the right nostril, point the applicator out toward the right ear. In the left nostril, point the applicator out toward the left ear ?Consider saline nasal rinses as needed for nasal symptoms. Use this before any medicated nasal sprays for best result ?Consider updating your environmental allergy testing.  Remember to stop antihistamines for 3 days prior to the testing appointment.  ? ?Allergic conjunctivitis ?- partially controlled ?Some over the counter eye drops include Pataday one drop in each eye once a day as needed for red, itchy eyes OR Zaditor one drop in each eye twice a day as needed for red itchy eyes. ? ?Reflux ?- stable ?Continue dietary  lifestyle modifications as listed below ?Continue to follow-up with your gastroenterologist as recommended ? ?Call the clinic if this treatment plan is not working well for you. ? ?Follow up in 2 months or sooner if needed. ?

## 2021-05-12 ENCOUNTER — Telehealth: Payer: Self-pay | Admitting: Internal Medicine

## 2021-05-12 MED ORDER — AIRDUO DIGIHALER 113-14 MCG/ACT IN AEPB
1.0000 | INHALATION_SPRAY | Freq: Two times a day (BID) | RESPIRATORY_TRACT | 5 refills | Status: DC
Start: 2021-05-12 — End: 2021-06-29

## 2021-05-12 NOTE — Telephone Encounter (Signed)
Thanks

## 2021-05-12 NOTE — Telephone Encounter (Signed)
I called the patient's pharmacy and Symbicort generic is $73 and name brand is $75. Even though in epic it shows that Symbicort is covered by their insurance. Please advise.  ?

## 2021-05-12 NOTE — Telephone Encounter (Signed)
Patient called and said that the symbicort is too expensive and needs something else [facility] [address] [phone]. ?

## 2021-05-12 NOTE — Telephone Encounter (Signed)
I called the patient and AirDuo was one of the alternative inhalers that she was told would be covered.  She verbalized understanding that AirDuo 113 is a dry powder inhaler so she would not be able to use her spacer. I also gave her the number to [facility] ?[phone]). ?

## 2021-05-12 NOTE — Telephone Encounter (Signed)
Can we send in AirDuo 113, 1 puffs twice daily to [facility]? I am not sure if the other combination inhalers will have any better coverage, but this one should be covered.  Let her know this is a dry powder so she will not use it with a spacer.

## 2021-05-14 ENCOUNTER — Other Ambulatory Visit: Payer: Self-pay | Admitting: Nurse Practitioner

## 2021-06-15 ENCOUNTER — Telehealth: Payer: Self-pay | Admitting: Internal Medicine

## 2021-06-15 NOTE — Telephone Encounter (Signed)
Patient called and said that she was having trouble getting the air duo from the mail order pharmacy. They said they were mailing it to her and then they cancelled. [phone] ?

## 2021-06-15 NOTE — Telephone Encounter (Signed)
I am confused---did she run out of AirDuo before the month was up or did she never get it because a PA was required? She should be using it as 1 puff twice a day.   ? ?Our plan was to continue Flovent 110 for 2 weeks in addition to the dual inhaler (originally symbicort, but this was too expensive) and then once her asthma flare resolved, she could just use the airduo 1 puff twice a day.  ? ?Is this what she is doing?  ?Was the PA to get the medication earlier than 30 days or just to get the medication? ? ?Doesn't AirDuo have the program where it is covered for [employer] with a max of 25 or 35 dollars?  ? ?I am fine with any dual inhaler (advair, wixela, symbicort, dulera, airduo).  Whichever is cheapest for her is fine with me, but I'm not sure what the denial was for based on the first message.  ?

## 2021-06-15 NOTE — Telephone Encounter (Signed)
Patient called march 23,2023 stating that she was having issues with the Symbicort due to cost. That is when she was switched to the airduo. It was showing at the time it was covered by her insurance. I spoke to her back in march that the airduo was sent in and to call us if she had any issues with cost or getting the medication. She never called the office back. On Monday I was given a prior authorization for the airduo and I called the patient to see if she was able to start the medication if she was never able to get the Airduo. She stated she was able to get the airduo in march and it worked. When she was trying to refill it she got three different answers. 1.It was not ready for shipment yet  2. It was too early for her to refill it. 3. It was denied by her insurance.  ? ?Patient never stated any issues with getting or using the Flovent 110 inhaler.  Prior authorization was denied twice for the airduo do we need to send in another inhaler. I will call her tomorrow about the flovent inhaler and how she has been using it.  ?

## 2021-06-15 NOTE — Telephone Encounter (Signed)
I called patient on Monday in regards to the Airduo. The patient stated that she was able to get the medication, but when she tried to get a refill the pharmacist told her it was too early for her to refill her medication. She later was told a prior authorization was needed for the Airduo. I completed the prior authorization, but it was denied twice by her insurance. ?

## 2021-06-15 NOTE — Telephone Encounter (Signed)
Spoke with patient, she informed me that she spoke with someone yesterday and was informed that a PA would be done. I spoke with Diandra and she informed me that she did do the PA and it has been denied. Patient stated "I guess I'm stuck with the Flovent", I informed patient I would reach out to Dr. Simona Huh to see what her recommendation is. Dr. Simona Huh please advise. ?

## 2021-06-16 NOTE — Telephone Encounter (Signed)
Awesome. Thank you Diandra.

## 2021-06-16 NOTE — Telephone Encounter (Signed)
I spoke to the patient and at this time she is doing well with just the Flovent inhaler. She will call and let us know if she is having trouble with her breathing. She is fine with try dulera or advair at a later time.  ?

## 2021-06-28 NOTE — Progress Notes (Signed)
? ?Follow Up Note ? ?RE: Ann Valenzuela MRN: [id number] DOB: December 10, 1965 ?Date of Office Visit: 06/29/2021 ? ?Referring provider: Lennie Odor, PA ?Primary care provider: Lennie Odor, PA ? ?Chief Complaint: Follow-up ? ?History of Present Illness: ?I had the pleasure of seeing Ann Valenzuela for a follow up visit at the [facility] on 06/29/2021. She is a 56 y.o. female, who is being followed for asthma, allergic rhinoconjunctivitis and reflux. Her previous allergy office visit was on 05/11/2021 with Dr. Simona Huh. Today is a regular follow up visit. ? ?Asthma  ?Patient never picked up Symbicort due to cost ($70-80). ? ?Currently on Flovent 173mg 2 puffs twice a day and montelukast daily. ?Levoalbuterol works without elevating her blood pressure and likes it better than albuterol.  ? ?Denies any ER/urgent care visits or prednisone use since the last visit. ? ?Asthma usually flares in the spring and fall.  ? ?Allergic rhinitis ?Currently having itchy/watery eyes using pataday once a day with some benefit. ?Taking Claritin and Xyzal 1/2 to 1 tablet at night.  ? ?Using Flonase/azelastine with good benefit. ?[place] will find out in May and depending on that will decide whether to start AIT or not.  ? ?Reflux ?Stable.  ? ?Assessment and Plan: ?RHarjitis a 56y.o. female with: ?Moderate persistent asthma without complication ?Usually flares in spring and fall. Took prednisone in March with good benefit. Symbicort too expensive. Levoalbuterol works better than albuterol. ?Today's spirometry showed some restriction. ?Daily controller medication(s): Flovent 1143m 2 puffs twice a day with spacer and rinse mouth afterwards. ?Continue Singulair (montelukast) '10mg'$  daily at night. ?In the spring and fall START Airduo 11324m1 puff twice a day and rinse mouth afterwards.  ?[facility] then check the pricing for the  other ICS/LABA inhalers - list given.  ?May use levoalbuterol rescue inhaler 2 puffs or nebulizer every 4 to 6 hours as needed for shortness of breath, chest tightness, coughing, and wheezing. Monitor frequency of use.  ?Get spirometry at next visit. ? ?Other allergic rhinitis ?Past history - Rhinoconjunctivitis symptoms for 20+ years mainly in the spring and fall. Last skin testing was over 7 years ago which showed multiple positives per patient report.  [place] 2007[place] with unknown benefit. Records scanned. Wears CPAP at night.  ?Interim history - itchy eyes.  ?Continue environmental control measures as below. ?Use over the counter antihistamines such as Zyrtec (cetirizine), Claritin (loratadine), Allegra (fexofenadine), or Xyzal (levocetirizine) daily as needed. May take twice a day during allergy flares. May switch antihistamines every few months. ?Continue with montelukast (Singulair) '10mg'$  daily.  ?Start Ryaltris (olopatadine + mometasone nasal spray combination) 1-2 sprays per nostril twice a day. Sample given. ?This will be mailed to you. This replaces Flonase and azelastine.  ?Use cromolyn 4% 1 drop in each eye up to four times a day as needed for itchy/watery eyes.  ?Nasal saline spray (i.e., Simply Saline) or nasal saline lavage (i.e., NeilMed) is recommended as needed and prior to medicated nasal sprays. ?Consider allergy injections for long term control if above medications do not help the symptoms - will need to test beforehand. ? ?Allergic conjunctivitis of both eyes ?See assessment and plan as above. ? ?Gastroesophageal reflux disease ?Stable. ?Continue omeprazole. ? ?Return in about 4 months (around 10/30/2021). ? ?Meds ordered this encounter  ?Medications  ? cromolyn (OPTICROM) 4 % ophthalmic solution  ?  Sig: Place 1 drop into both eyes 4 (four) times daily as needed (itchy/watery eyes).  ?  Dispense:  10 mL  ?  Refill:  3  ? Olopatadine-Mometasone (RYALTRIS) [id number]  MCG/ACT SUSP  ?  Sig: Place 1-2 sprays into the nose in the morning and at bedtime.  ?  Dispense:  29 g  ?  Refill:  5  ?  [phone]  ? Fluticasone-Salmeterol,sensor, (AIRDUO [place]) 113-14 MCG/ACT AEPB  ?  Sig: Inhale 1 puff into the lungs in the morning and at bedtime. Add during the spring and fall. Rinse mouth after each use.  ?  Dispense:  1 each  ?  Refill:  3  ?  [phone]  ? ?Lab Orders  ?No laboratory test(s) ordered today  ? ? ?Diagnostics: ?Spirometry:  ?Tracings reviewed. Her effort: Good reproducible efforts. ?FVC: 2.28L ?FEV1: 1.77L, 72% predicted ?FEV1/FVC ratio: 78% ?Interpretation: Spirometry consistent with possible restrictive disease.  ?Please see scanned spirometry results for details. ? ?Medication List:  ?Current Outpatient Medications  ?Medication Sig Dispense Refill  ? Ascorbic Acid (VITAMIN C) 1000 MG tablet Take 1,000 mg by mouth daily.    ? atorvastatin (LIPITOR) 10 MG tablet Take 10 mg by mouth daily.    ? B Complex Vitamins (VITAMIN B COMPLEX) TABS See admin instructions.    ? Cholecalciferol (VITAMIN D) 50 MCG (2000 UT) tablet 1 tablet    ? cromolyn (OPTICROM) 4 % ophthalmic solution Place 1 drop into both eyes 4 (four) times daily as needed (itchy/watery eyes). 10 mL 3  ? fenofibrate (TRICOR) 145 MG tablet Take 145 mg by mouth daily. Takes at bedtime    ? fenofibrate (TRICOR) 145 MG tablet Take 1 tablet by mouth daily.    ? FIBER PO Take 5 capsules by mouth daily.    ? fluticasone (FLOVENT HFA) 110 MCG/ACT inhaler Inhale 2 puffs into the lungs in the morning and at bedtime. with spacer and rinse mouth afterwards. 1 each 5  ? Fluticasone-Salmeterol,sensor, (AIRDUO DIGIHALER) 113-14 MCG/ACT AEPB Inhale 1 puff into the lungs in the morning and at bedtime. Add during the spring and fall. Rinse mouth after each use. 1 each 3  ? levalbuterol (XOPENEX HFA) 45 MCG/ACT inhaler Inhale 1-2 puffs into the lungs every 6 (six) hours as needed for wheezing or shortness of breath. 1 each 12   ? levocetirizine (XYZAL) 5 MG tablet     ? loratadine (CLARITIN) 10 MG tablet Take 10 mg by mouth daily.    ? losartan (COZAAR) 25 MG tablet Take 25 mg by mouth daily.    ? Magnesium 250 MG TABS 1 tablet with a meal    ? metFORMIN (GLUCOPHAGE) 500 MG tablet Take 500 mg by mouth 2 (two) times daily.    ? Misc Natural Products (GLUCOSAMINE CHOND COMPLEX/MSM) TABS See admin instructions.    ? montelukast (SINGULAIR) 10 MG tablet Take 1 tablet (10 mg total) by mouth at bedtime. 90 tablet 2  ? Multiple Vitamin (MULTIVITAMIN) tablet Take 1 tablet by mouth daily.    ? Olopatadine-Mometasone (RYALTRIS) [id number] MCG/ACT SUSP Place 1-2 sprays into the nose in the morning and at bedtime. 29 g 5  ? omeprazole (PRILOSEC) 40 MG capsule TAKE 1 CAPSULE(40 MG) BY MOUTH IN THE MORNING AND AT BEDTIME 60 capsule 1  ? Potassium 75 MG TABS     ? Zinc Sulfate (ZINC 15 PO) Take 30 mg by mouth.    ? levalbuterol (XOPENEX) 0.31 MG/3ML nebulizer solution Take 3 mLs (  0.31 mg total) by nebulization every 6 (six) hours as needed for wheezing or shortness of breath. (Patient not taking: Reported on 06/29/2021) 3 mL 5  ? ?No current facility-administered medications for this visit.  ? ?Allergies: ?Allergies  ?Allergen Reactions  ? Albuterol Hypertension  ? Hydrochlorothiazide Cough  ? Lisinopril Cough  ? Other Other (See Comments)  ?  Seasonal allergies  ?Other reaction(s): Unknown  ? ?I reviewed her past medical history, social history, family history, and environmental history and no significant changes have been reported from her previous visit. ? ?Review of Systems  ?Constitutional:  Negative for appetite change, chills, fever and unexpected weight change.  ?HENT:  Negative for congestion and rhinorrhea.   ?Eyes:  Positive for itching.  ?Respiratory:  Negative for cough, chest tightness, shortness of breath and wheezing.   ?Cardiovascular:  Negative for chest pain.  ?Gastrointestinal:  Negative for abdominal pain.  ?Genitourinary:  Negative for  difficulty urinating.  ?Skin:  Negative for rash.  ?Allergic/Immunologic: Positive for environmental allergies.  ?Neurological:  Negative for headaches.  ? ?Objective: ?BP 130/82   Pulse 71   Temp 97.8

## 2021-06-29 ENCOUNTER — Ambulatory Visit: Payer: 59 | Admitting: Allergy

## 2021-06-29 ENCOUNTER — Encounter: Payer: Self-pay | Admitting: Allergy

## 2021-06-29 VITALS — BP 130/82 | HR 71 | Temp 97.8°F | Resp 16 | Ht 62.0 in | Wt 171.1 lb

## 2021-06-29 DIAGNOSIS — H1013 Acute atopic conjunctivitis, bilateral: Secondary | ICD-10-CM

## 2021-06-29 DIAGNOSIS — J4540 Moderate persistent asthma, uncomplicated: Secondary | ICD-10-CM | POA: Diagnosis not present

## 2021-06-29 DIAGNOSIS — K219 Gastro-esophageal reflux disease without esophagitis: Secondary | ICD-10-CM | POA: Diagnosis not present

## 2021-06-29 DIAGNOSIS — J3089 Other allergic rhinitis: Secondary | ICD-10-CM

## 2021-06-29 MED ORDER — AIRDUO DIGIHALER 113-14 MCG/ACT IN AEPB
1.0000 | INHALATION_SPRAY | Freq: Two times a day (BID) | RESPIRATORY_TRACT | 3 refills | Status: DC
Start: 2021-06-29 — End: 2021-11-30

## 2021-06-29 MED ORDER — CROMOLYN SODIUM 4 % OP SOLN: 1.0000 [drp] | Freq: Four times a day (QID) | OPHTHALMIC | 3 refills | Status: DC | PRN

## 2021-06-29 MED ORDER — RYALTRIS 665-25 MCG/ACT NA SUSP: 1.0000 | Freq: Two times a day (BID) | NASAL | 5 refills | Status: DC

## 2021-06-29 NOTE — Assessment & Plan Note (Signed)
Usually flares in spring and fall. Took prednisone in March with good benefit. Symbicort too expensive. Levoalbuterol works better than albuterol. ?? Today's spirometry showed some restriction. ?? Daily controller medication(s): Flovent 1109mg 2 puffs twice a day with spacer and rinse mouth afterwards. ?o Continue Singulair (montelukast) '10mg'$  daily at night. ?o In the spring and fall START Airduo 1185m 1 puff twice a day and rinse mouth afterwards.  ?- [facility] the pricing for the other ICS/LABA inhalers - list given.  ?? May use levoalbuterol rescue inhaler 2 puffs or nebulizer every 4 to 6 hours as needed for shortness of breath, chest tightness, coughing, and wheezing. Monitor frequency of use.  ?? Get spirometry at next visit. ?

## 2021-06-29 NOTE — Assessment & Plan Note (Signed)
Stable.  Continue omeprazole.

## 2021-06-29 NOTE — Assessment & Plan Note (Signed)
Past history - Rhinoconjunctivitis symptoms for 20+ years mainly in the spring and fall. Last skin testing was over 7 years ago which showed multiple positives per patient report.  Allergy injections in [place] from 2007-2010 with unknown benefit. Records scanned. Wears CPAP at night.  ?Interim history - itchy eyes.  ?? Continue environmental control measures as below. ?? Use over the counter antihistamines such as Zyrtec (cetirizine), Claritin (loratadine), Allegra (fexofenadine), or Xyzal (levocetirizine) daily as needed. May take twice a day during allergy flares. May switch antihistamines every few months. ?? Continue with montelukast (Singulair) '10mg'$  daily.  ?? Start Ryaltris (olopatadine + mometasone nasal spray combination) 1-2 sprays per nostril twice a day. Sample given. ?o This will be mailed to you. This replaces Flonase and azelastine.  ?? Use cromolyn 4% 1 drop in each eye up to four times a day as needed for itchy/watery eyes.  ?? Nasal saline spray (i.e., Simply Saline) or nasal saline lavage (i.e., NeilMed) is recommended as needed and prior to medicated nasal sprays. ?? Consider allergy injections for long term control if above medications do not help the symptoms - will need to test beforehand. ?

## 2021-06-29 NOTE — Patient Instructions (Addendum)
Asthma: ?Daily controller medication(s): Flovent 173mg 2 puffs twice a day with spacer and rinse mouth afterwards. ?Continue Singulair (montelukast) '10mg'$  daily at night. ?In the spring and fall START Airduo  1161m 1 puff twice a day and rinse mouth afterwards.  ?[facility] ? ?Check the pricing for the following inhalers: ?Dulera ?Advair HFA ?Advair Diskus ?Wixela ?Breo ?Airduo Respiclick ?Airduo Digihaler ?Symbicort ? ?May use levoalbuterol rescue inhaler 2 puffs or nebulizer every 4 to 6 hours as needed for shortness of breath, chest tightness, coughing, and wheezing. Monitor frequency of use.  ?Asthma control goals:  ?Full participation in all desired activities (may need albuterol before activity) ?Albuterol use two times or less a week on average (not counting use with activity) ?Cough interfering with sleep two times or less a month ?Oral steroids no more than once a year ?No hospitalizations  ? ?Environmental allergies ?Continue environmental control measures as below. ?Use over the counter antihistamines such as Zyrtec (cetirizine), Claritin (loratadine), Allegra (fexofenadine), or Xyzal (levocetirizine) daily as needed. May take twice a day during allergy flares. May switch antihistamines every few months. ?Continue with montelukast (singulair) '10mg'$  daily.  ?Start Ryaltris (olopatadine + mometasone nasal spray combination) 1-2 sprays per nostril twice a day. Sample given. ?This will be mailed to you. ?This replaces flonase and azelastine.  ?Use cromolyn 4% 1 drop in each eye up to four times a day as needed for itchy/watery eyes.  ?Nasal saline spray (i.e., Simply Saline) or nasal saline lavage (i.e., NeilMed) is recommended as needed and prior to medicated nasal sprays. ?Consider allergy injections for long term control if above medications do not help the symptoms - will need to test beforehand. ? ?Follow up in 4 months or sooner if needed.   ? ?Reducing Pollen Exposure ?Pollen seasons: trees (spring), grass (summer) and ragweed/weeds (fall). ?Keep windows closed in your home and car to lower pollen exposure.  ?Install air conditioning in the bedroom and throughout the house if possible.  ?Avoid going out in dry windy days - especially early morning. ?Pollen counts are highest between 5 - 10 AM and on dry, hot and windy days.  ?Save outside activities for late afternoon or after a heavy rain, when pollen levels are lower.  ?Avoid mowing of grass if you have grass pollen allergy. ?Be aware that pollen can also be transported indoors on people and pets.  ?Dry your clothes in an automatic dryer rather than hanging them outside where they might collect pollen.  ?Rinse hair and eyes before bedtime. ?

## 2021-06-29 NOTE — Assessment & Plan Note (Signed)
.   See assessment and plan as above. 

## 2021-07-06 ENCOUNTER — Encounter (HOSPITAL_COMMUNITY): Payer: Self-pay

## 2021-07-20 ENCOUNTER — Other Ambulatory Visit: Payer: Self-pay | Admitting: Nurse Practitioner

## 2021-09-14 IMAGING — MG DIGITAL DIAGNOSTIC BILAT W/ TOMO W/ CAD
8 of 15 series · 8 of 40 positions shown · non-contrast
Comparison: Previous exam(s).

CLINICAL DATA: 54-year-old female presenting with 2 palpable lumps
in the right breast. The patient recently moved here from [place].
Prior outside workups have revealed multiple benign, simple cyst.

EXAM:
DIGITAL DIAGNOSTIC BILATERAL MAMMOGRAM WITH CAD AND TOMO
ULTRASOUND BILATERAL BREAST

[R TAN synth-2D (1 of 2)]
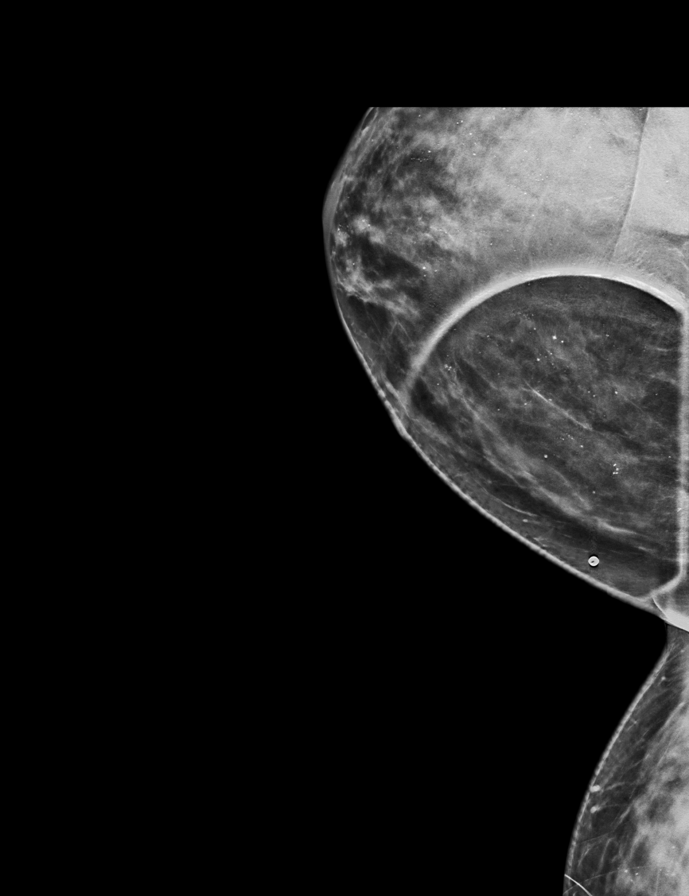

[L CC synth-2D]
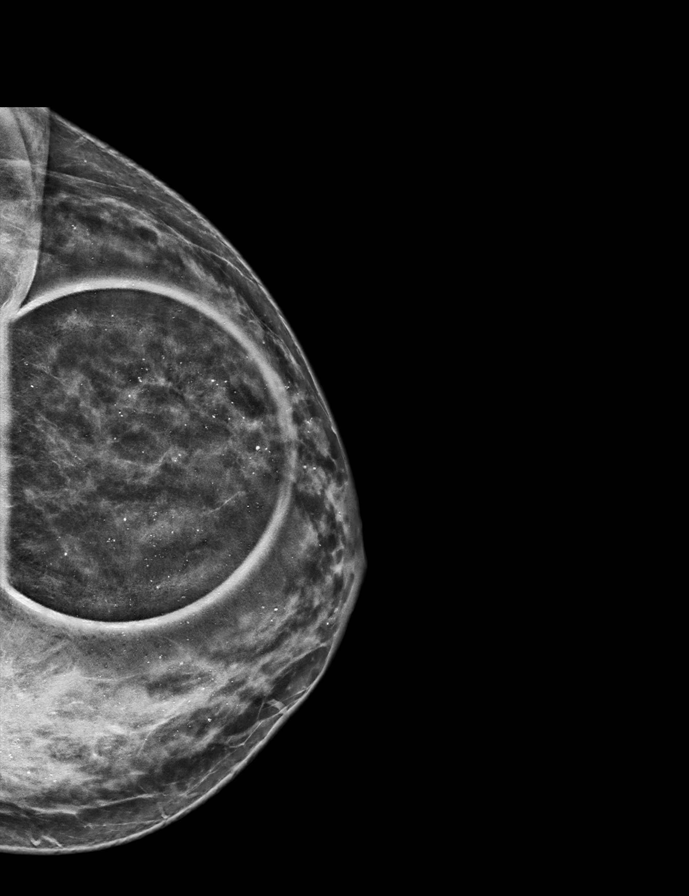

[R TAN synth-2D (2 of 2)]
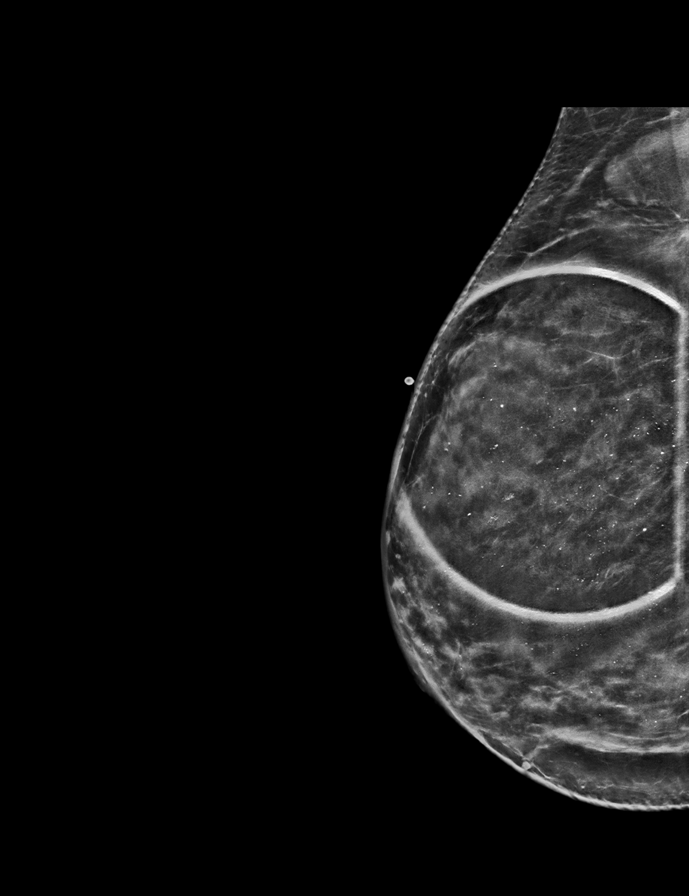

[R CC synth-2D]
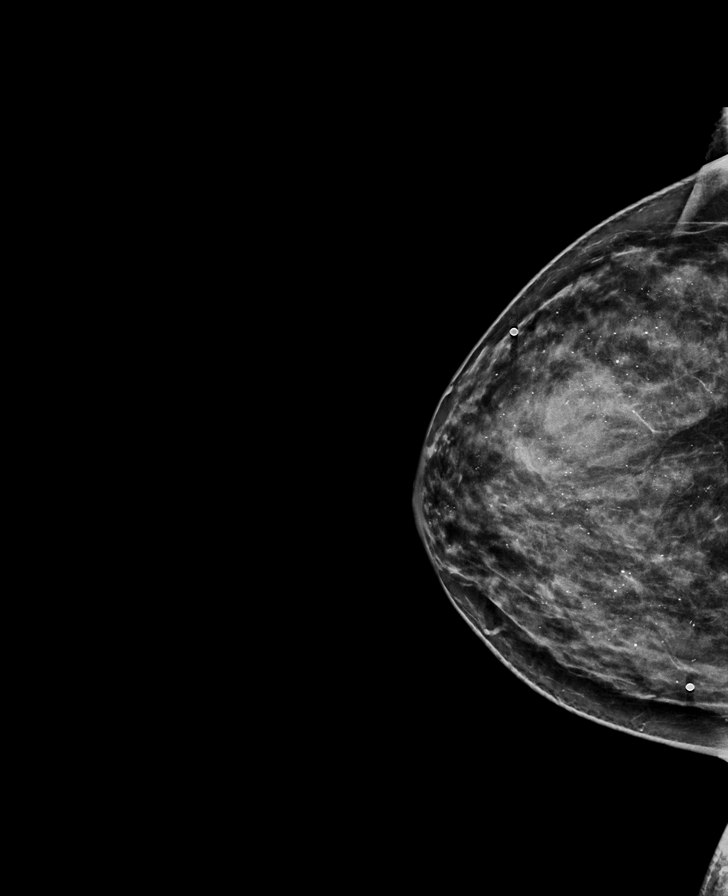

[R MLO synth-2D]
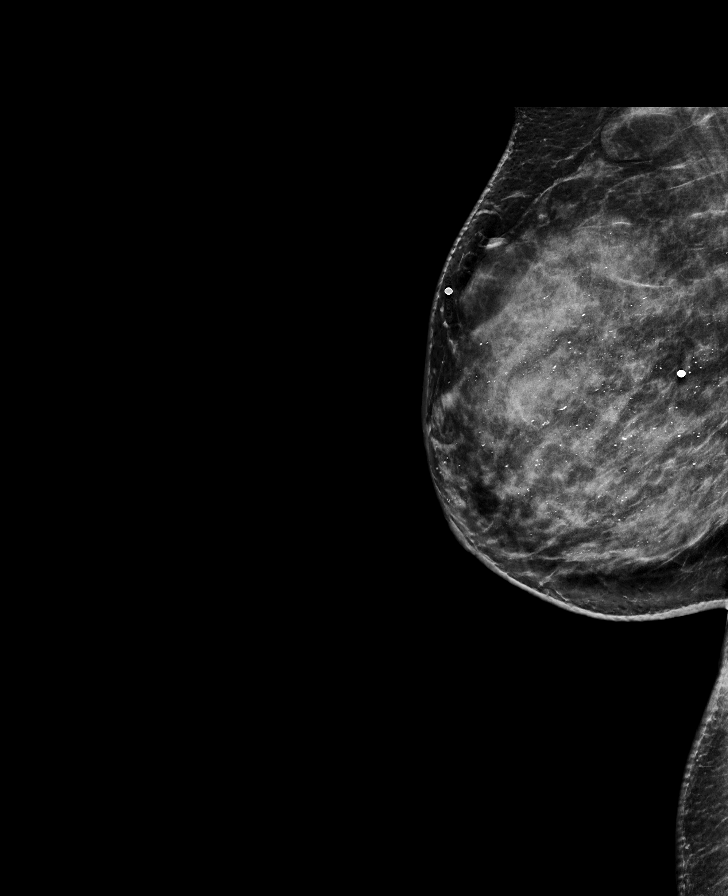

[L MLO synth-2D (1 of 2)]
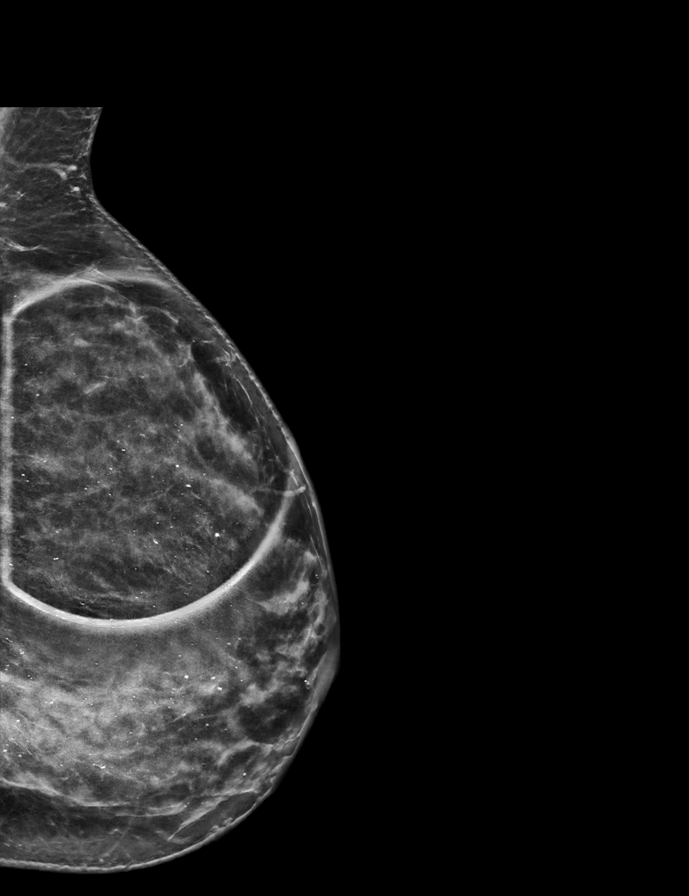

[L MLO synth-2D (2 of 2)]
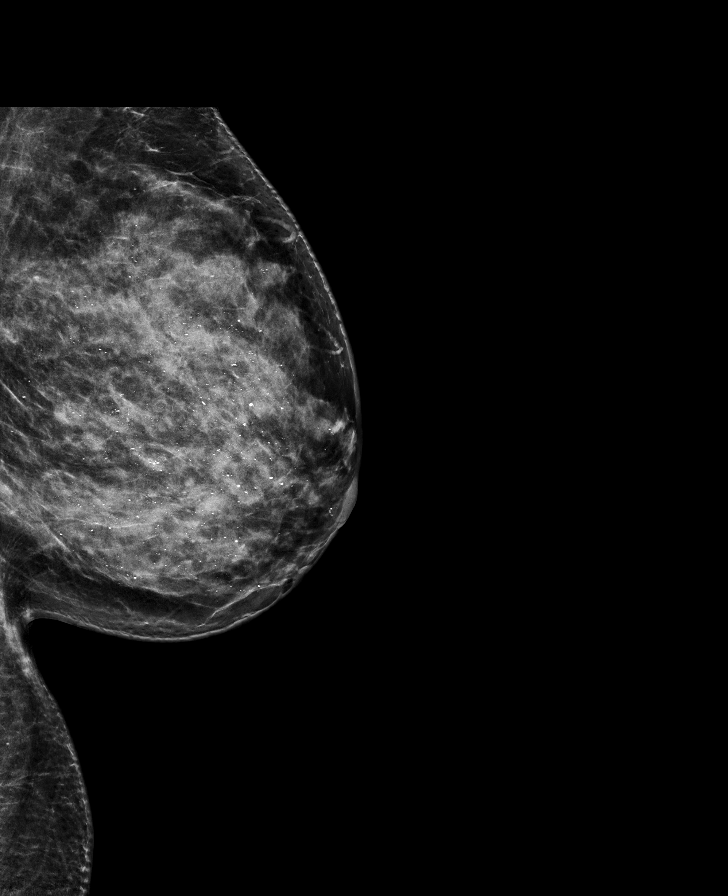

[R TAN tomo · tomo slice 47/69.0]
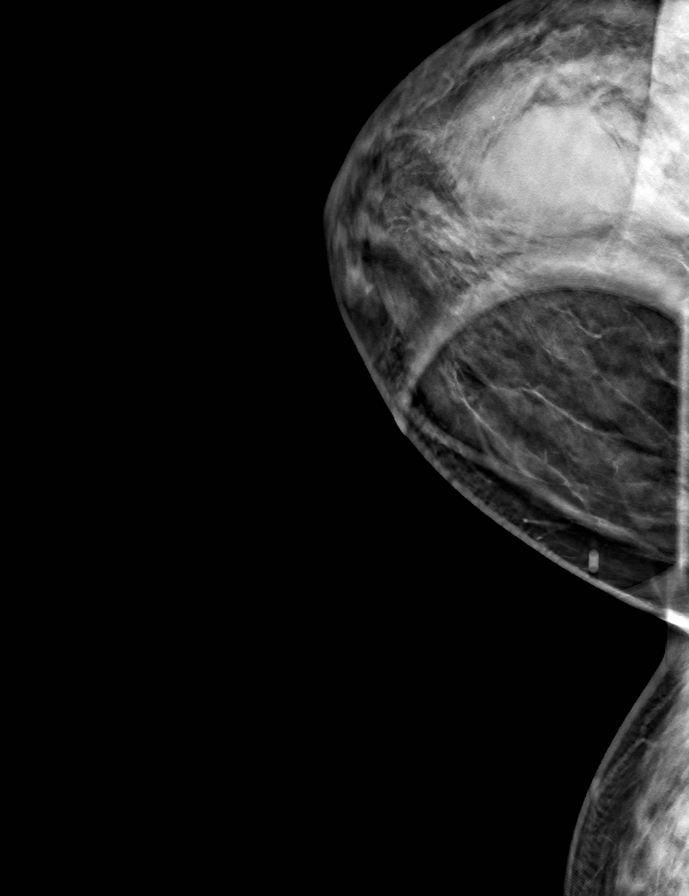

[8 of 40 positions shown; findings below may reference images not displayed]

ACR Breast Density Category d: The breast tissue is extremely dense,
which lowers the sensitivity of mammography.
FINDINGS: Radiopaque BBs were placed at the site of the patient's palpable
lumps in the upper outer and far medial right breast. Oval,
circumscribed equal density masses are seen deep to both radiopaque
BBs, similar to prior examinations. Other oval circumscribed masses
are noted bilaterally, again similar to prior study.

An area of focal distortion in the upper-outer quadrant of the left
breast at middle depth persists on additional spot compression
views. Further evaluation with ultrasound was performed.

Mammographic images were processed with CAD.

Targeted ultrasound is performed, showing an oval, circumscribed
completely anechoic mass with posterior acoustic enhancement at the
11 o'clock position 4 cm from the nipple on the right. It measures
3.3 x 3.1 x 2.0 cm. There is no internal vascularity. This could
correlates well with the mammographic finding and is consistent with
a benign simple cyst.

There is an oval, circumscribed hypoechoic mass at the [DATE] position
9 cm from the nipple on the right. It measures 1.8 x 1.6 x 0.8 cm.
There is no internal vascularity. This correlates with the
mammographic and palpable finding in the medial right breast.

Evaluation of the left axilla demonstrates numerous scattered benign
cysts but no focal findings corresponding with the area of
mammographically identified distortion. Evaluation of the left
axilla demonstrates no suspicious lymphadenopathy.
IMPRESSION: 1. Suspicious left breast distortion in the upper outer quadrant.
Recommendation is for stereotactic biopsy.
2. Indeterminate right breast mass at the [DATE] position 9 cm from
the nipple. Recommendation is for ultrasound-guided biopsy.
3. Benign simple cyst in the right breast at the 11 o'clock
position. The patient would like symptomatic aspiration of this
cyst. This can be performed at the same time as the
ultrasound-guided biopsy.
4. No suspicious left axillary lymphadenopathy.
5. Please note ultrasound evaluation of the right axilla was not
performed at this time of this study.

RECOMMENDATION:
1. Stereotactic guided biopsy of the upper outer left breast.
2. Ultrasound-guided biopsy of the right breast at the [DATE] position
9 cm from the nipple.
3. Ultrasound-guided aspiration of the right breast simple cyst at
the 11 o'clock position.
4. Ultrasound evaluation of the right axilla to be performed at the
time of biopsy.

I have discussed the findings and recommendations with the patient.
If applicable, a reminder letter will be sent to the patient
regarding the next appointment.

BI-RADS CATEGORY  4: Suspicious.

## 2021-09-19 ENCOUNTER — Other Ambulatory Visit: Payer: Self-pay | Admitting: Nurse Practitioner

## 2021-11-02 ENCOUNTER — Ambulatory Visit: Payer: 59 | Admitting: Allergy

## 2021-11-09 ENCOUNTER — Ambulatory Visit: Payer: 59 | Admitting: Allergy

## 2021-11-29 NOTE — Progress Notes (Unsigned)
Follow Up Note  RE: Ann Valenzuela MRN: [id number] DOB: Mar 02, 1965 Date of Office Visit: 11/30/2021  Referring provider: Lennie Odor, PA Primary care provider: Lennie Odor, PA  Chief Complaint: No chief complaint on file.  History of Present Illness: I had the pleasure of seeing Ann Valenzuela for a follow up visit at the [facility] on 11/29/2021. She is a 56 y.o. female, who is being followed for asthma, allergic rhinoconjunctivitis and GERD. Her previous allergy office visit was on 06/29/2021 with Dr. Maudie Mercury. Today is a regular follow up visit.  Moderate persistent asthma without complication Usually flares in spring and fall. Took prednisone in March with good benefit. Symbicort too expensive. Levoalbuterol works better than albuterol. Today's spirometry showed some restriction. Daily controller medication(s): Flovent 178mg 2 puffs twice a day with spacer and rinse mouth afterwards. Continue Singulair (montelukast) '10mg'$  daily at night. In the spring and fall START Airduo 1151m 1 puff twice a day and rinse mouth afterwards.  [facility] check the pricing for the other ICS/LABA inhalers - list given.  May use levoalbuterol rescue inhaler 2 puffs or nebulizer every 4 to 6 hours as needed for shortness of breath, chest tightness, coughing, and wheezing. Monitor frequency of use.  Get spirometry at next visit.   Other allergic rhinitis Past history - Rhinoconjunctivitis symptoms for 20+ years mainly in the spring and fall. Last skin testing was over 7 years ago which showed multiple positives per patient report.  [place]. Records scanned. Wears CPAP at night.  Interim history - itchy eyes.  Continue environmental control measures as below. Use over the counter antihistamines such as Zyrtec (cetirizine), Claritin (loratadine), Allegra (fexofenadine),  or Xyzal (levocetirizine) daily as needed. May take twice a day during allergy flares. May switch antihistamines every few months. Continue with montelukast (Singulair) '10mg'$  daily.  Start Ryaltris (olopatadine + mometasone nasal spray combination) 1-2 sprays per nostril twice a day. Sample given. This will be mailed to you. This replaces Flonase and azelastine.  Use cromolyn 4% 1 drop in each eye up to four times a day as needed for itchy/watery eyes.  Nasal saline spray (i.e., Simply Saline) or nasal saline lavage (i.e., NeilMed) is recommended as needed and prior to medicated nasal sprays. Consider allergy injections for long term control if above medications do not help the symptoms - will need to test beforehand.   Allergic conjunctivitis of both eyes See assessment and plan as above.   Gastroesophageal reflux disease Stable. Continue omeprazole.   Return in about 4 months (around 10/30/2021).  Assessment and Plan: ReKristes a [age].o. female with: No problem-specific Assessment & Plan notes found for this encounter.  No follow-ups on file.  No orders of the defined types were placed in this encounter.  Lab Orders  No laboratory test(s) ordered today    Diagnostics: Spirometry:  Tracings reviewed. Her effort: {Blank single:19197::"Good reproducible efforts.","It was hard to get consistent efforts and there is a question as to whether this reflects a maximal maneuver.","Poor effort, data can not be interpreted."} FVC: ***L FEV1: ***L, ***% predicted FEV1/FVC ratio: ***% Interpretation: {Blank single:19197::"Spirometry consistent with mild obstructive disease","Spirometry consistent with moderate obstructive disease","Spirometry consistent with severe obstructive disease","Spirometry consistent with possible restrictive disease","Spirometry consistent with mixed obstructive and restrictive disease","Spirometry uninterpretable due to technique","Spirometry consistent with normal  pattern","No overt abnormalities noted given today's efforts"}.  Please see scanned spirometry  results for details.  Skin Testing: {Blank single:19197::"Select foods","Environmental allergy panel","Environmental allergy panel and select foods","Food allergy panel","None","Deferred due to recent antihistamines use"}. *** Results discussed with patient/family.   Medication List:  Current Outpatient Medications  Medication Sig Dispense Refill  . Ascorbic Acid (VITAMIN C) 1000 MG tablet Take 1,000 mg by mouth daily.    Marland Kitchen atorvastatin (LIPITOR) 10 MG tablet Take 10 mg by mouth daily.    . B Complex Vitamins (VITAMIN B COMPLEX) TABS See admin instructions.    . Cholecalciferol (VITAMIN D) 50 MCG (2000 UT) tablet 1 tablet    . cromolyn (OPTICROM) 4 % ophthalmic solution Place 1 drop into both eyes 4 (four) times daily as needed (itchy/watery eyes). 10 mL 3  . fenofibrate (TRICOR) 145 MG tablet Take 145 mg by mouth daily. Takes at bedtime    . fenofibrate (TRICOR) 145 MG tablet Take 1 tablet by mouth daily.    Marland Kitchen FIBER PO Take 5 capsules by mouth daily.    . fluticasone (FLOVENT HFA) 110 MCG/ACT inhaler Inhale 2 puffs into the lungs in the morning and at bedtime. with spacer and rinse mouth afterwards. 1 each 5  . Fluticasone-Salmeterol,sensor, (AIRDUO DIGIHALER) 113-14 MCG/ACT AEPB Inhale 1 puff into the lungs in the morning and at bedtime. Add during the spring and fall. Rinse mouth after each use. 1 each 3  . levalbuterol (XOPENEX HFA) 45 MCG/ACT inhaler Inhale 1-2 puffs into the lungs every 6 (six) hours as needed for wheezing or shortness of breath. 1 each 12  . levalbuterol (XOPENEX) 0.31 MG/3ML nebulizer solution Take 3 mLs (0.31 mg total) by nebulization every 6 (six) hours as needed for wheezing or shortness of breath. (Patient not taking: Reported on 06/29/2021) 3 mL 5  . levocetirizine (XYZAL) 5 MG tablet     . loratadine (CLARITIN) 10 MG tablet Take 10 mg by mouth daily.    Marland Kitchen losartan  (COZAAR) 25 MG tablet Take 25 mg by mouth daily.    . Magnesium 250 MG TABS 1 tablet with a meal    . metFORMIN (GLUCOPHAGE) 500 MG tablet Take 500 mg by mouth 2 (two) times daily.    . Misc Natural Products (GLUCOSAMINE CHOND COMPLEX/MSM) TABS See admin instructions.    . montelukast (SINGULAIR) 10 MG tablet Take 1 tablet (10 mg total) by mouth at bedtime. 90 tablet 2  . Multiple Vitamin (MULTIVITAMIN) tablet Take 1 tablet by mouth daily.    Donata Clay (RYALTRIS) [id number] MCG/ACT SUSP Place 1-2 sprays into the nose in the morning and at bedtime. 29 g 5  . omeprazole (PRILOSEC) 40 MG capsule Take 1 capsule (40 mg total) by mouth in the morning and at bedtime. **PLEASE CONTACT THE OFFICE TO SCHEDULE FOLLOW UP 60 capsule 0  . Potassium 75 MG TABS     . Zinc Sulfate (ZINC 15 PO) Take 30 mg by mouth.     No current facility-administered medications for this visit.   Allergies: Allergies  Allergen Reactions  . Albuterol Hypertension  . Hydrochlorothiazide Cough  . Lisinopril Cough  . Other Other (See Comments)    Seasonal allergies  Other reaction(s): Unknown   I reviewed her past medical history, social history, family history, and environmental history and no significant changes have been reported from her previous visit.  Review of Systems  Constitutional:  Negative for appetite change, chills, fever and unexpected weight change.  HENT:  Negative for congestion and rhinorrhea.   Eyes:  Positive for itching.  Respiratory:  Negative for cough, chest tightness, shortness of breath and wheezing.   Cardiovascular:  Negative for chest pain.  Gastrointestinal:  Negative for abdominal pain.  Genitourinary:  Negative for difficulty urinating.  Skin:  Negative for rash.  Allergic/Immunologic: Positive for environmental allergies.  Neurological:  Negative for headaches.   Objective: There were no vitals taken for this visit. There is no height or weight on file to calculate  BMI. Physical Exam Vitals and nursing note reviewed.  Constitutional:      Appearance: Normal appearance. She is well-developed.  HENT:     Head: Normocephalic and atraumatic.     Right Ear: External ear normal.     Left Ear: External ear normal.     Nose: Nose normal.     Mouth/Throat:     Mouth: Mucous membranes are moist.     Pharynx: Oropharynx is clear.  Eyes:     Conjunctiva/sclera: Conjunctivae normal.  Cardiovascular:     Rate and Rhythm: Normal rate and regular rhythm.     Heart sounds: Normal heart sounds. No murmur heard.    No friction rub. No gallop.  Pulmonary:     Effort: Pulmonary effort is normal.     Breath sounds: Normal breath sounds. No wheezing, rhonchi or rales.  Abdominal:     Palpations: Abdomen is soft.  Musculoskeletal:     Cervical back: Neck supple.  Skin:    General: Skin is warm.     Findings: No rash.  Neurological:     Mental Status: She is alert and oriented to person, place, and time.  Psychiatric:        Behavior: Behavior normal.  Previous notes and tests were reviewed. The plan was reviewed with the patient/family, and all questions/concerned were addressed.  It was my pleasure to see Glenda today and participate in her care. Please feel free to contact me with any questions or concerns.  Sincerely,  Rexene Alberts, DO Allergy & Immunology  [facility] office: [place] office: [phone]

## 2021-11-30 ENCOUNTER — Ambulatory Visit: Payer: 59 | Admitting: Allergy

## 2021-11-30 ENCOUNTER — Telehealth: Payer: Self-pay | Admitting: Allergy

## 2021-11-30 ENCOUNTER — Encounter: Payer: Self-pay | Admitting: Allergy

## 2021-11-30 ENCOUNTER — Other Ambulatory Visit: Payer: Self-pay

## 2021-11-30 VITALS — BP 124/86 | HR 96 | Temp 98.1°F | Resp 16 | Ht 62.0 in | Wt 136.4 lb

## 2021-11-30 DIAGNOSIS — J4540 Moderate persistent asthma, uncomplicated: Secondary | ICD-10-CM

## 2021-11-30 DIAGNOSIS — J3089 Other allergic rhinitis: Secondary | ICD-10-CM | POA: Diagnosis not present

## 2021-11-30 DIAGNOSIS — H1013 Acute atopic conjunctivitis, bilateral: Secondary | ICD-10-CM | POA: Diagnosis not present

## 2021-11-30 DIAGNOSIS — K219 Gastro-esophageal reflux disease without esophagitis: Secondary | ICD-10-CM | POA: Diagnosis not present

## 2021-11-30 MED ORDER — BUDESONIDE-FORMOTEROL FUMARATE 80-4.5 MCG/ACT IN AERO: 2.0000 | INHALATION_SPRAY | Freq: Two times a day (BID) | RESPIRATORY_TRACT | 3 refills | Status: DC

## 2021-11-30 NOTE — Telephone Encounter (Signed)
Noted  

## 2021-11-30 NOTE — Assessment & Plan Note (Signed)
Stable.  Continue omeprazole.

## 2021-11-30 NOTE — Assessment & Plan Note (Signed)
Past history - Rhinoconjunctivitis symptoms for 20+ years mainly in the spring and fall. Last skin testing was over 7 years ago which showed multiple positives per patient report.  Allergy injections in [place] from 2007-2010 with unknown benefit. Records scanned. Wears CPAP at night.  Interim history - symptoms flared since last week.   Continue environmental control measures as below.  Use over the counter antihistamines such as Zyrtec (cetirizine), Claritin (loratadine), Allegra (fexofenadine), or Xyzal (levocetirizine) daily as needed. May take twice a day during allergy flares. May switch antihistamines every few months.  Continue with montelukast (Singulair) '10mg'$  daily.  . Use azelastine nasal spray 1-2 sprays per nostril twice a day as needed for runny nose/drainage. . Use Flonase (fluticasone) nasal spray 1 spray per nostril twice a day as needed for nasal congestion.  . Use cromolyn 4% 1 drop in each eye up to four times a day as needed for itchy/watery eyes.  . Nasal saline spray (i.e., Simply Saline) or nasal saline lavage (i.e., NeilMed) is recommended as needed and prior to medicated nasal sprays.

## 2021-11-30 NOTE — Telephone Encounter (Signed)
Patient states she paid $115.00 for the symbicort and decided to go ahead and get it today. Patient states she will look into other options for the next time.

## 2021-11-30 NOTE — Patient Instructions (Addendum)
Asthma: If you are not better by Friday - call us in the morning.  Take mucinex twice a day with plenty of water for the next few days.  Start prednisone taper if not feeling better by Friday. Prednisone '10mg'$  tablets - take 2 tablets for 4 days then 1 tablet on day 5.   Daily controller medication(s): start Symbicort 48mg 2 puffs twice a day with spacer and rinse mouth afterwards. Continue Singulair (montelukast) '10mg'$  daily at night. In the spring and fall START  Symbicort 843m 2 puffs twice a day and rinse mouth afterwards.  Otherwise use Flovent 11041m2 puffs twice a day during the winter and summer.   Check the pricing for the following inhalers if too expensive.  DulRuthe Mannanvair HFA Advair Diskus Wixela Breo Airduo Respiclick Airduo Digihaler  May use levoalbuterol rescue inhaler 2 puffs or nebulizer every 4 to 6 hours as needed for shortness of breath, chest tightness, coughing, and wheezing. Monitor frequency of use.  Asthma control goals:  Full participation in all desired activities (may need albuterol before activity) Albuterol use two times or less a week on average (not counting use with activity) Cough interfering with sleep two times or less a month Oral steroids no more than once a year No hospitalizations   Environmental allergies Continue environmental control measures as below. Use over the counter antihistamines such as Zyrtec (cetirizine), Claritin (loratadine), Allegra (fexofenadine), or Xyzal (levocetirizine) daily as needed. May take twice a day during allergy flares. May switch antihistamines every few months. Continue with montelukast (singulair) '10mg'$  daily.  Use azelastine nasal spray 1-2 sprays per nostril twice a day as needed for runny nose/drainage. Use Flonase (fluticasone) nasal spray 1 spray per nostril twice a day as needed for nasal congestion.  Use cromolyn 4% 1 drop in each eye up to four times a day as needed for itchy/watery eyes.  Nasal  saline spray (i.e., Simply Saline) or nasal saline lavage (i.e., NeilMed) is recommended as needed and prior to medicated nasal sprays.  Follow up in 5 months or sooner if needed.   Reducing Pollen Exposure Pollen seasons: trees (spring), grass (summer) and ragweed/weeds (fall). Keep windows closed in your home and car to lower pollen exposure.  Install air conditioning in the bedroom and throughout the house if possible.  Avoid going out in dry windy days - especially early morning. Pollen counts are highest between 5 - 10 AM and on dry, hot and windy days.  Save outside activities for late afternoon or after a heavy rain, when pollen levels are lower.  Avoid mowing of grass if you have grass pollen allergy. Be aware that pollen can also be transported indoors on people and pets.  Dry your clothes in an automatic dryer rather than hanging them outside where they might collect pollen.  Rinse hair and eyes before bedtime.  Drink plenty of fluids. Water, juice, clear broth or warm lemon water are good choices. Avoid caffeine and alcohol, which can dehydrate you. Eat chicken soup. Chicken soup and other warm fluids can be soothing and loosen congestion. Rest. Adjust your room's temperature and humidity. Keep your room warm but not overheated. If the air is dry, a cool-mist humidifier or vaporizer can moisten the air and help ease congestion and coughing. Keep the humidifier clean to prevent the growth of bacteria and molds. Soothe your throat. Perform a saltwater gargle. Dissolve one-quarter to a half teaspoon of salt in a 4- to 8-ounce glass of warm water. This can  relieve a sore or scratchy throat temporarily. Use saline nasal drops. To help relieve nasal congestion, try saline nasal drops. You can buy these drops over the counter, and they can help relieve symptoms ? even in children. Take over-the-counter cold and cough medications. For adults and children older than 5, over-the-counter  decongestants, antihistamines and pain relievers might offer some symptom relief. However, they won't prevent a cold or shorten its duration.

## 2021-11-30 NOTE — Assessment & Plan Note (Signed)
Past history - Usually flares in spring and fall. Took prednisone in March with good benefit. Symbicort too expensive. Levoalbuterol works better than albuterol. Interim history - no issues until the past week. No prednisone since last OV.  . Today's spirometry was normal. . If you are not better by Friday - call us in the morning. . Take mucinex twice a day with plenty of water for the next few days. . Start prednisone taper if not feeling better by Friday. Prednisone '10mg'$  tablets - take 2 tablets for 4 days then 1 tablet on day 5.  . Daily controller medication(s): start Symbicort 7mg 2 puffs twice a day with spacer and rinse mouth afterwards o Continue Singulair (montelukast) '10mg'$  daily at night. o In the spring and fall START Symbicort 851m 2 puffs twice a day and rinse mouth afterwards.  - Otherwise use Flovent 11018m2 puffs twice a day during the winter and summer.  . May use levoalbuterol rescue inhaler 2 puffs or nebulizer every 4 to 6 hours as needed for shortness of breath, chest tightness, coughing, and wheezing. Monitor frequency of use.  . Get spirometry at next visit.

## 2022-01-02 IMAGING — MG MM BREAST SURGICAL SPECIMEN
1 series · 2 of 2 positions shown · non-contrast
Comparison: Previous exam(s).

CLINICAL DATA: Evaluate surgical specimen following excision of
LEFT breast distortion.

EXAM:
SPECIMEN RADIOGRAPH OF THE LEFT BREAST

[Series 1: L · left · 0.07mm/px · 2 of 2 slices shown]
[im 1/2]
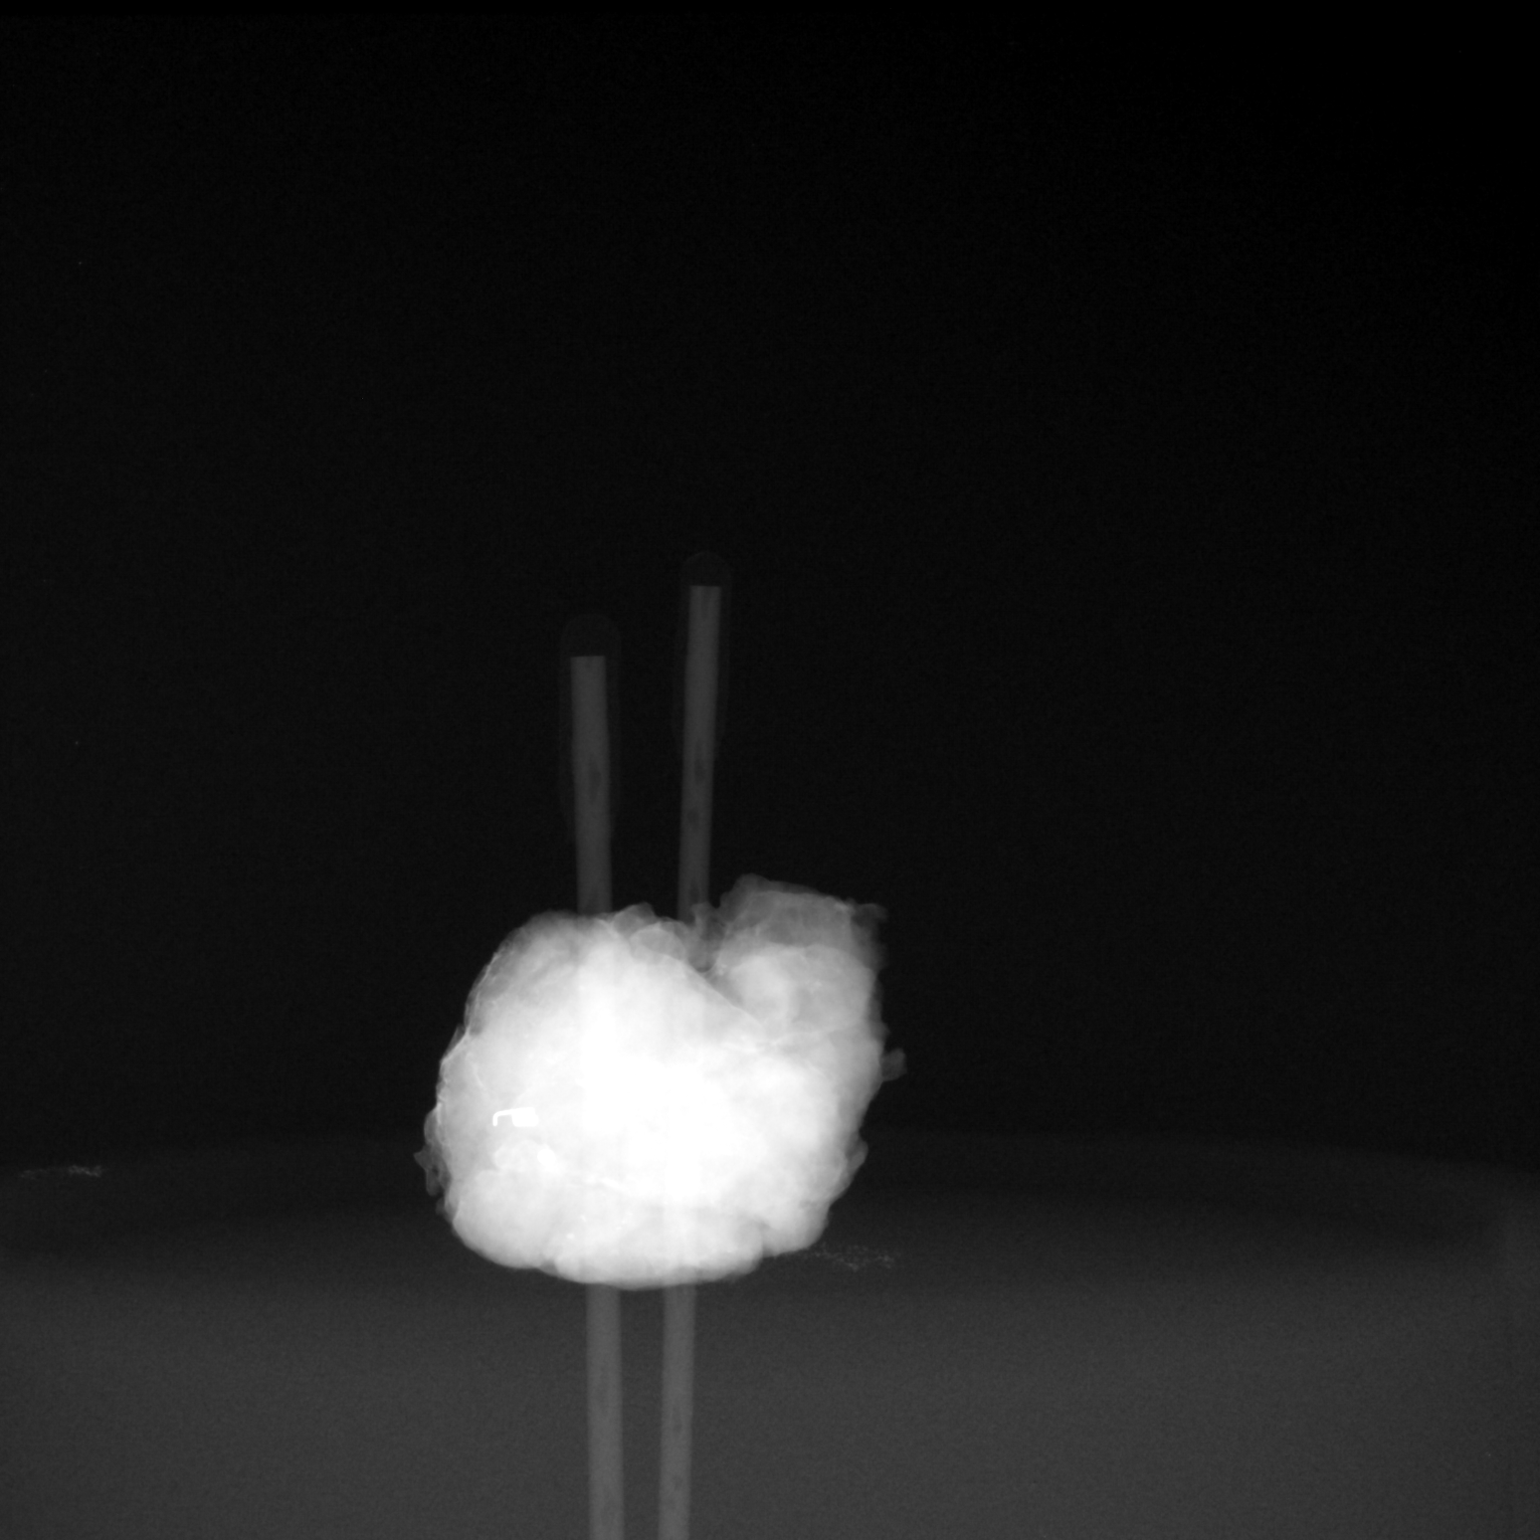
[im 2/2]
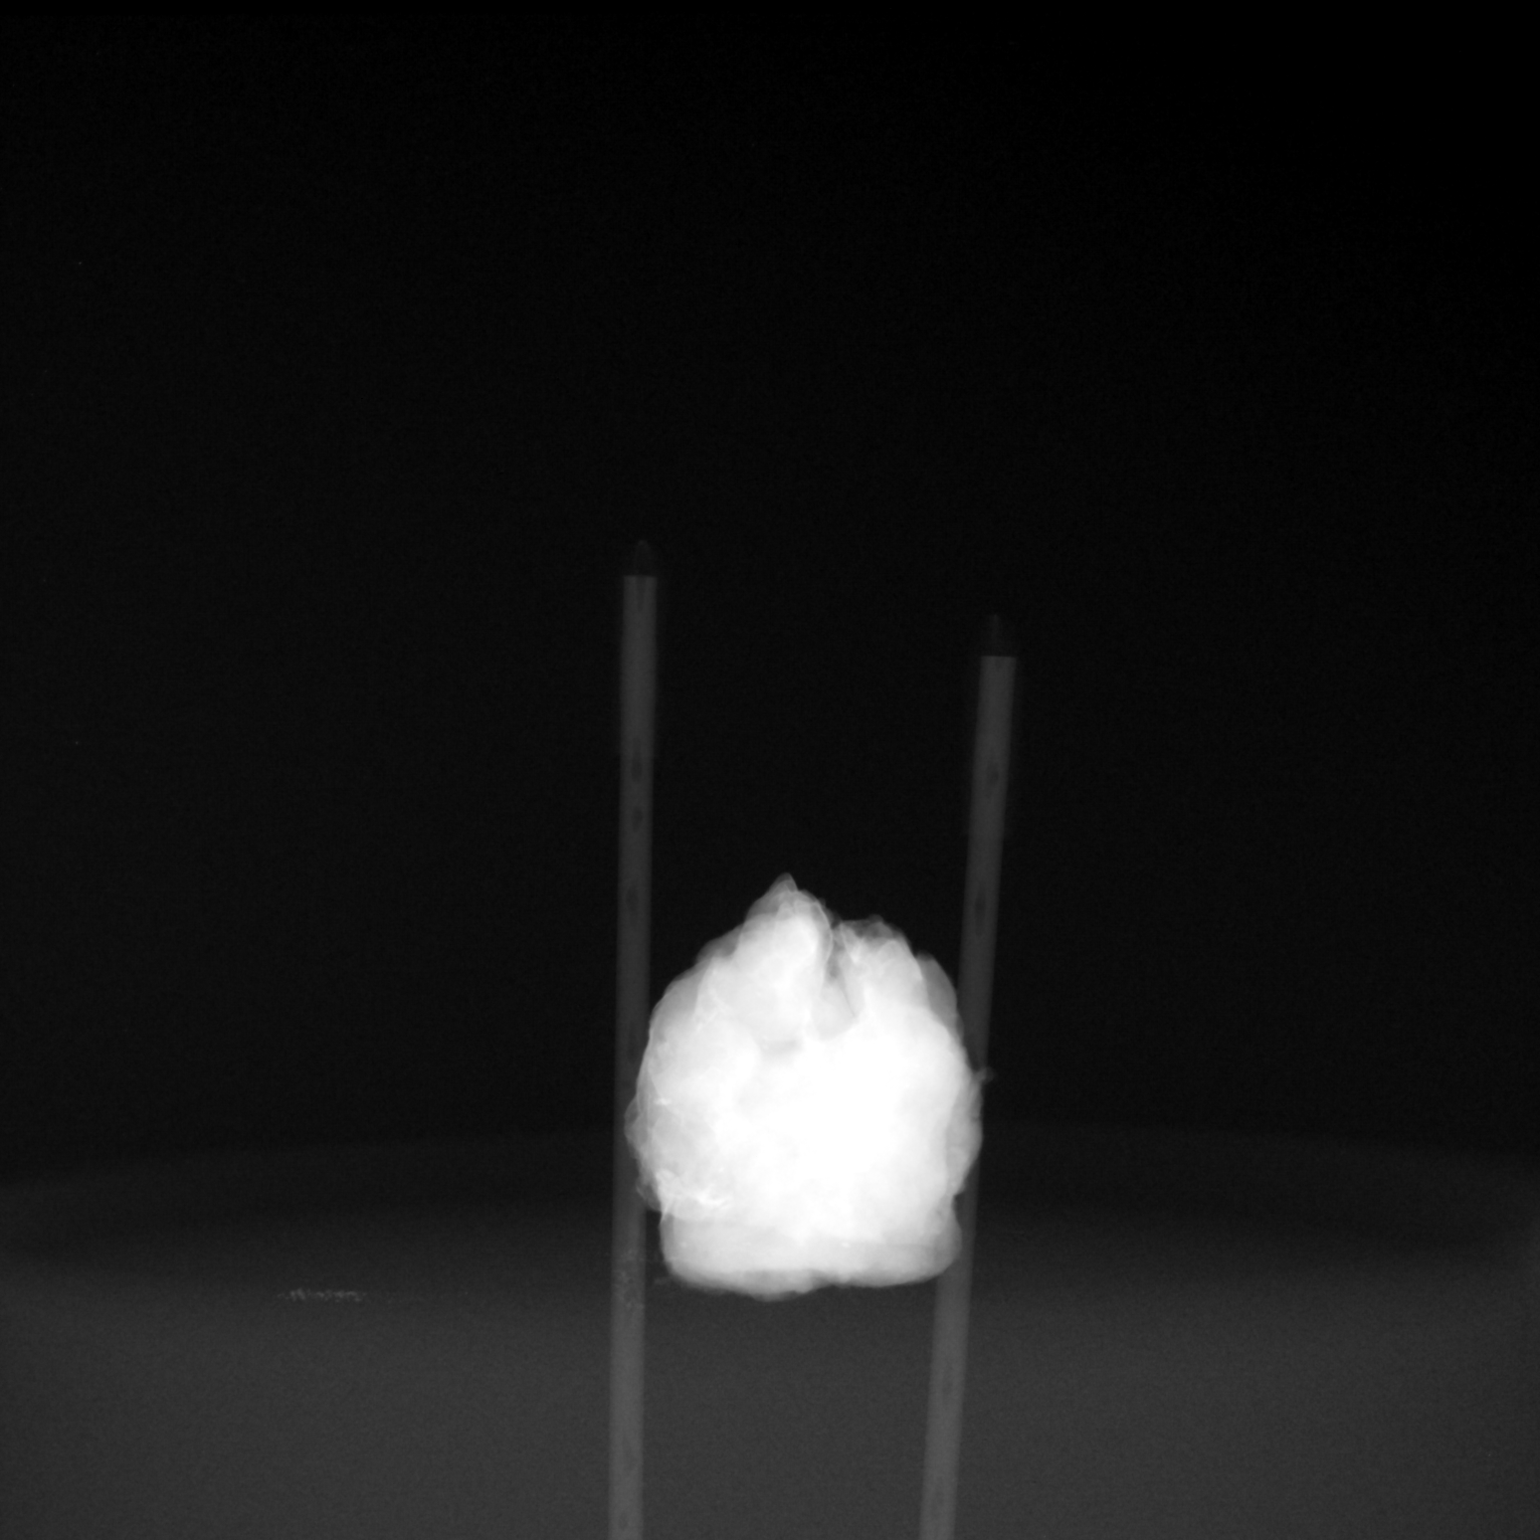

[2 of 2 positions shown; findings below may reference images not displayed]

FINDINGS: Status post excision of the LEFT breast. The radioactive seed and
COIL biopsy marker clip are present and completely intact.
IMPRESSION: Specimen radiograph of the LEFT breast.

## 2022-02-07 ENCOUNTER — Encounter: Payer: Self-pay | Admitting: General Surgery

## 2022-02-09 ENCOUNTER — Other Ambulatory Visit: Payer: Self-pay | Admitting: Physician Assistant

## 2022-02-09 DIAGNOSIS — N644 Mastodynia: Secondary | ICD-10-CM

## 2022-04-11 ENCOUNTER — Other Ambulatory Visit: Payer: Self-pay | Admitting: Family Medicine

## 2022-04-13 ENCOUNTER — Ambulatory Visit
Admission: RE | Admit: 2022-04-13 | Discharge: 2022-04-13 | Disposition: A | Discharge status: Home / Self Care | Payer: 59 | Source: Ambulatory Visit | Attending: Physician Assistant | Admitting: Physician Assistant

## 2022-04-13 DIAGNOSIS — N644 Mastodynia: Secondary | ICD-10-CM

## 2022-10-25 ENCOUNTER — Telehealth: Payer: Self-pay

## 2022-10-25 MED ORDER — BUDESONIDE-FORMOTEROL FUMARATE 80-4.5 MCG/ACT IN AERO: 2.0000 | INHALATION_SPRAY | Freq: Two times a day (BID) | RESPIRATORY_TRACT | 3 refills | Status: DC

## 2022-10-25 NOTE — Telephone Encounter (Signed)
Patient called and expressed that she tested positive for Covid yesterday. Patient stated that the symptoms of fever, cough, and runny nose began late Monday evening. Denies any trouble breathing. Patient is currently on ibuprofen and tylenol for fever and also taking glucosamine. Patient is taking her daily medications as well. However she needed a refill on the Symbicort. Patient would like to know what else she should take and her next steps through this virus. Please advise.     I have refilled the Symbicort.

## 2022-10-25 NOTE — Telephone Encounter (Signed)
Please call patient back.  Did she contact her PCP? She may qualify for Paxlovid but I don't have any recent bloodwork regarding her kidneys which is needed to know which dose of Paxlovid to prescribe.  She can contact her PCP and see if they are willing to prescribe paxlovid and to see if she has a current creatinine level labwork.   Recommend to continue with Symbicort 2 puffs BID and singulair daily.  May use levoalbuterol rescue inhaler 2 puffs or nebulizer every 4 to 6 hours as needed for shortness of breath, chest tightness, coughing, and wheezing.   Patient due for follow up - schedule in 1 month.  Thank you.

## 2022-10-26 ENCOUNTER — Other Ambulatory Visit: Payer: Self-pay | Admitting: *Deleted

## 2022-10-26 MED ORDER — FLUTICASONE-SALMETEROL 115-21 MCG/ACT IN AERO: 2.0000 | INHALATION_SPRAY | Freq: Two times a day (BID) | RESPIRATORY_TRACT | 3 refills | Status: DC

## 2022-10-26 NOTE — Addendum Note (Signed)
Addended by: Ellamae Sia on: 10/26/2022 04:22 PM   Modules accepted: Orders

## 2022-10-26 NOTE — Telephone Encounter (Signed)
I called and spoke with the patient and advised. Patient verbalized understanding. She stated that she was having issued with picking up the Symbicort inhaler I called the pharmacy and they stated that the generic Symbicort is $104, but that generic Advair is $21. Would you like to switch inhalers?

## 2022-10-26 NOTE — Telephone Encounter (Signed)
Sent in Advair 2 puffs BID.

## 2022-11-02 ENCOUNTER — Other Ambulatory Visit: Payer: Self-pay | Admitting: Allergy

## 2022-11-21 NOTE — Progress Notes (Unsigned)
Follow Up Note  RE: Ann Valenzuela MRN: [id number] DOB: 20-May-1965 Date of Office Visit: 11/22/2022  Referring provider: Milus Height, PA Primary care provider: Milus Height, PA  Chief Complaint: No chief complaint on file.  History of Present Illness: I had the pleasure of seeing Ann Valenzuela for a follow up visit at the [facility] on 11/21/2022. She is a 57 y.o. female, who is being followed for asthma and allergic rhinoconjunctivitis. Her previous allergy office visit was on 11/30/2021 with Dr. Selena Batten. Today is a regular follow up visit.  Discussed the use of AI scribe software for clinical note transcription with the patient, who gave verbal consent to proceed.  History of Present Illness          Patient states she paid $115.00 for the symbicort and decided to go ahead and get it today. Patient states she will look into other options for the next time.   Sent in Advair 2 puffs BID.      Moderate persistent asthma without complication Past history - Usually flares in spring and fall. Took prednisone in March with good benefit. Symbicort too expensive. Levoalbuterol works better than albuterol. Interim history - no issues until the past week. No prednisone since last OV.  Today's spirometry was normal. If you are not better by Friday - call us in the morning. Take mucinex twice a day with plenty of water for the next few days. Start prednisone taper if not feeling better by Friday. Prednisone 10mg  tablets - take 2 tablets for 4 days then 1 tablet on day 5.  Daily controller medication(s): start Symbicort 2 puffs twice a day with spacer and rinse mouth afterwards Continue Singulair (montelukast) 10mg  daily at night. In the spring and fall START Symbicort 2 puffs twice a day and rinse mouth afterwards.  Otherwise use Flovent 2 puffs twice a day during the winter and summer.  May use levoalbuterol rescue inhaler 2 puffs or  nebulizer every 4 to 6 hours as needed for shortness of breath, chest tightness, coughing, and wheezing. Monitor frequency of use.  Get spirometry at next visit.   Other allergic rhinitis Past history - Rhinoconjunctivitis symptoms for 20+ years mainly in the spring and fall. Last skin testing was over 7 years ago which showed multiple positives per patient report.  Allergy injections in [place] from 2007-2010 with unknown benefit. Records scanned. Wears CPAP at night.  Interim history - symptoms flared since last week.  Continue environmental control measures as below. Use over the counter antihistamines such as Zyrtec (cetirizine), Claritin (loratadine), Allegra (fexofenadine), or Xyzal (levocetirizine) daily as needed. May take twice a day during allergy flares. May switch antihistamines every few months. Continue with montelukast (Singulair) 10mg  daily.  Use azelastine nasal spray 1-2 sprays per nostril twice a day as needed for runny nose/drainage. Use Flonase (fluticasone) nasal spray 1 spray per nostril twice a day as needed for nasal congestion.  Use cromolyn 4% 1 drop in each eye up to four times a day as needed for itchy/watery eyes.  Nasal saline spray (i.e., Simply Saline) or nasal saline lavage (i.e., NeilMed) is recommended as needed and prior to medicated nasal sprays.   Gastroesophageal reflux disease Stable. Continue omeprazole.  Assessment and Plan: Ann Valenzuela is a 57 y.o. female with: *** Assessment and Plan              No follow-ups on file.  No orders of the defined types were  placed in this encounter.  Lab Orders  No laboratory test(s) ordered today    Diagnostics: Spirometry:  Tracings reviewed. Her effort: {Blank single:19197::"Good reproducible efforts.","It was hard to get consistent efforts and there is a question as to whether this reflects a maximal maneuver.","Poor effort, data can not be interpreted."} FVC: ***L FEV1: ***L, ***%  predicted FEV1/FVC ratio: ***% Interpretation: {Blank single:19197::"Spirometry consistent with mild obstructive disease","Spirometry consistent with moderate obstructive disease","Spirometry consistent with severe obstructive disease","Spirometry consistent with possible restrictive disease","Spirometry consistent with mixed obstructive and restrictive disease","Spirometry uninterpretable due to technique","Spirometry consistent with normal pattern","No overt abnormalities noted given today's efforts"}.  Please see scanned spirometry results for details.  Skin Testing: {Blank single:19197::"Select foods","Environmental allergy panel","Environmental allergy panel and select foods","Food allergy panel","None","Deferred due to recent antihistamines use"}. *** Results discussed with patient/family.   Medication List:  Current Outpatient Medications  Medication Sig Dispense Refill   atorvastatin (LIPITOR) 10 MG tablet Take 10 mg by mouth daily.     azelastine (ASTELIN) 0.1 % nasal spray Place into both nostrils.     cromolyn (OPTICROM) 4 % ophthalmic solution Place 1 drop into both eyes 4 (four) times daily as needed (itchy/watery eyes). 10 mL 3   fenofibrate (TRICOR) 145 MG tablet Take 145 mg by mouth daily. Takes at bedtime     fenofibrate (TRICOR) 145 MG tablet Take 1 tablet by mouth daily.     fluticasone (FLOVENT HFA) 110 MCG/ACT inhaler Inhale 2 puffs into the lungs in the morning and at bedtime. with spacer and rinse mouth afterwards. 1 each 5   fluticasone-salmeterol (ADVAIR HFA) 115-21 MCG/ACT inhaler Inhale 2 puffs into the lungs 2 (two) times daily. with spacer and rinse mouth afterwards. 1 each 3   levalbuterol (XOPENEX HFA) 45 MCG/ACT inhaler Inhale 1-2 puffs into the lungs every 6 (six) hours as needed for wheezing or shortness of breath. 1 each 12   levalbuterol (XOPENEX) 0.31 MG/3ML nebulizer solution Take 3 mLs (0.31 mg total) by nebulization every 6 (six) hours as needed for  wheezing or shortness of breath. 3 mL 5   levocetirizine (XYZAL) 5 MG tablet      loratadine (CLARITIN) 10 MG tablet Take 10 mg by mouth daily.     losartan (COZAAR) 25 MG tablet Take 25 mg by mouth daily.     metFORMIN (GLUCOPHAGE) 500 MG tablet Take 500 mg by mouth 2 (two) times daily.     montelukast (SINGULAIR) 10 MG tablet TAKE 1 TABLET(10 MG) BY MOUTH AT BEDTIME 90 tablet 2   omeprazole (PRILOSEC) 40 MG capsule Take 1 capsule (40 mg total) by mouth in the morning and at bedtime. **PLEASE CONTACT THE OFFICE TO SCHEDULE FOLLOW UP 60 capsule 0   No current facility-administered medications for this visit.   Allergies: Allergies  Allergen Reactions   Albuterol Hypertension   Hydrochlorothiazide Cough   Lisinopril Cough   Other Other (See Comments)    Seasonal allergies  Other reaction(s): Unknown   I reviewed her past medical history, social history, family history, and environmental history and no significant changes have been reported from her previous visit.  Review of Systems  Constitutional:  Negative for appetite change, chills, fever and unexpected weight change.  HENT:  Positive for congestion, rhinorrhea and sneezing.   Eyes:  Positive for itching.  Respiratory:  Positive for cough. Negative for chest tightness, shortness of breath and wheezing.   Cardiovascular:  Negative for chest pain.  Gastrointestinal:  Negative for abdominal pain.  Genitourinary:  Negative  for difficulty urinating.  Skin:  Negative for rash.  Allergic/Immunologic: Positive for environmental allergies.  Neurological:  Negative for headaches.    Objective: There were no vitals taken for this visit. There is no height or weight on file to calculate BMI. Physical Exam Vitals and nursing note reviewed.  Constitutional:      Appearance: Normal appearance. She is well-developed.  HENT:     Head: Normocephalic and atraumatic.     Right Ear: External ear normal.     Left Ear: External ear normal.      Nose: Nose normal.     Mouth/Throat:     Mouth: Mucous membranes are moist.     Pharynx: Oropharynx is clear.  Eyes:     Conjunctiva/sclera: Conjunctivae normal.  Cardiovascular:     Rate and Rhythm: Normal rate and regular rhythm.     Heart sounds: Normal heart sounds. No murmur heard.    No friction rub. No gallop.  Pulmonary:     Effort: Pulmonary effort is normal.     Breath sounds: Normal breath sounds. No wheezing, rhonchi or rales.  Abdominal:     Palpations: Abdomen is soft.  Musculoskeletal:     Cervical back: Neck supple.  Skin:    General: Skin is warm.     Findings: No rash.  Neurological:     Mental Status: She is alert and oriented to person, place, and time.  Psychiatric:        Behavior: Behavior normal.    Previous notes and tests were reviewed. The plan was reviewed with the patient/family, and all questions/concerned were addressed.  It was my pleasure to see Ann Valenzuela today and participate in her care. Please feel free to contact me with any questions or concerns.  Sincerely,  Wyline Mood, DO Allergy & Immunology  [facility] office: [phone] [facility] office: [phone]

## 2022-11-22 ENCOUNTER — Ambulatory Visit: Payer: 59 | Admitting: Allergy

## 2022-11-22 ENCOUNTER — Encounter: Payer: Self-pay | Admitting: Allergy

## 2022-11-22 ENCOUNTER — Other Ambulatory Visit: Payer: Self-pay

## 2022-11-22 VITALS — BP 138/88 | HR 75 | Temp 98.3°F | Resp 18 | Ht 62.25 in | Wt 153.2 lb

## 2022-11-22 DIAGNOSIS — K219 Gastro-esophageal reflux disease without esophagitis: Secondary | ICD-10-CM

## 2022-11-22 DIAGNOSIS — J3089 Other allergic rhinitis: Secondary | ICD-10-CM | POA: Diagnosis not present

## 2022-11-22 DIAGNOSIS — H1013 Acute atopic conjunctivitis, bilateral: Secondary | ICD-10-CM

## 2022-11-22 DIAGNOSIS — J4540 Moderate persistent asthma, uncomplicated: Secondary | ICD-10-CM

## 2022-11-22 MED ORDER — OMEPRAZOLE 20 MG PO CPDR: 20.0000 mg | DELAYED_RELEASE_CAPSULE | Freq: Every day | ORAL | 5 refills | Status: DC

## 2022-11-22 NOTE — Patient Instructions (Addendum)
Asthma: Daily controller medication(s):  In the spring and fall START Flovent 1 puff twice a day and rinse mouth afterwards.  Continue Singulair (montelukast) 10mg  daily at night. During respiratory infections/flares:  Start Flovent 2 puffs twice a day for 1-2 weeks until your breathing symptoms return to baseline.  May use levoalbuterol rescue inhaler 2 puffs or nebulizer every 4 to 6 hours as needed for shortness of breath, chest tightness, coughing, and wheezing. Monitor frequency of use.  Asthma control goals:  Full participation in all desired activities (may need albuterol before activity) Albuterol use two times or less a week on average (not counting use with activity) Cough interfering with sleep two times or less a month Oral steroids no more than once a year No hospitalizations   Environmental allergies Continue environmental control measures as below. Use over the counter antihistamines such as Zyrtec (cetirizine), Claritin (loratadine), Allegra (fexofenadine), or Xyzal (levocetirizine) daily as needed. May take twice a day during allergy flares. May switch antihistamines every few months. Continue with montelukast (singulair) 10mg  daily.  Use azelastine nasal spray 1-2 sprays per nostril twice a day as needed for runny nose/drainage. Use Flonase (fluticasone) nasal spray 1-2 sprays per nostril once a day as needed for nasal congestion.  Nasal saline spray (i.e., Simply Saline) or nasal saline lavage (i.e., NeilMed) is recommended as needed and prior to medicated nasal sprays. Use cromolyn 4% 1 drop in each eye up to four times a day as needed for itchy/watery eyes.   Reflux Continue lifestyle and dietary modifications. Decrease omeprazole to 20mg  once day - nothing to eat or drink for 20-30 minutes afterwards.  If the 20mg  does not control symptoms let us know and will send in the 40mg  dose.   Follow up in 6 months or sooner if needed.   Reducing Pollen  Exposure Pollen seasons: trees (spring), grass (summer) and ragweed/weeds (fall). Keep windows closed in your home and car to lower pollen exposure.  Install air conditioning in the bedroom and throughout the house if possible.  Avoid going out in dry windy days - especially early morning. Pollen counts are highest between 5 - 10 AM and on dry, hot and windy days.  Save outside activities for late afternoon or after a heavy rain, when pollen levels are lower.  Avoid mowing of grass if you have grass pollen allergy. Be aware that pollen can also be transported indoors on people and pets.  Dry your clothes in an automatic dryer rather than hanging them outside where they might collect pollen.  Rinse hair and eyes before bedtime.

## 2023-03-10 ENCOUNTER — Other Ambulatory Visit: Payer: Self-pay | Admitting: Allergy

## 2023-03-22 ENCOUNTER — Telehealth: Payer: Self-pay | Admitting: Allergy

## 2023-03-22 MED ORDER — LEVALBUTEROL HCL 0.31 MG/3ML IN NEBU: 1.0000 | INHALATION_SOLUTION | Freq: Four times a day (QID) | RESPIRATORY_TRACT | 1 refills | Status: DC | PRN

## 2023-03-22 NOTE — Telephone Encounter (Signed)
Patient stated she needed her levalbuterol Pauline Aus) 0.31 MG/3ML nebulizer solution refilled to [facility] on  [address] [place], [place], [place] [address].  Best Contact: [phone]

## 2023-03-22 NOTE — Addendum Note (Signed)
Addended by: Ellamae Sia on: 03/22/2023 04:42 PM   Modules accepted: Orders

## 2023-03-22 NOTE — Telephone Encounter (Signed)
I called patient and informed that xopenex neb solution has been sent into the pharmacy.

## 2023-03-22 NOTE — Telephone Encounter (Signed)
Rx sent in

## 2023-04-12 ENCOUNTER — Other Ambulatory Visit: Payer: Self-pay

## 2023-04-12 MED ORDER — AZELASTINE HCL 0.1 % NA SOLN: 2.0000 | Freq: Two times a day (BID) | NASAL | 1 refills | Status: DC

## 2023-04-18 ENCOUNTER — Other Ambulatory Visit: Payer: Self-pay | Admitting: Physician Assistant

## 2023-04-18 DIAGNOSIS — Z1231 Encounter for screening mammogram for malignant neoplasm of breast: Secondary | ICD-10-CM

## 2023-04-27 ENCOUNTER — Other Ambulatory Visit: Payer: Self-pay

## 2023-04-27 MED ORDER — LEVALBUTEROL HCL 0.31 MG/3ML IN NEBU: 1.0000 | INHALATION_SOLUTION | Freq: Four times a day (QID) | RESPIRATORY_TRACT | 1 refills | Status: DC | PRN

## 2023-05-02 ENCOUNTER — Ambulatory Visit
Admission: RE | Admit: 2023-05-02 | Discharge: 2023-05-02 | Disposition: A | Discharge status: Home / Self Care | Payer: 59 | Source: Ambulatory Visit | Attending: Physician Assistant | Admitting: Physician Assistant

## 2023-05-02 DIAGNOSIS — Z1231 Encounter for screening mammogram for malignant neoplasm of breast: Secondary | ICD-10-CM

## 2023-05-07 ENCOUNTER — Other Ambulatory Visit: Payer: Self-pay | Admitting: Physician Assistant

## 2023-05-07 DIAGNOSIS — R928 Other abnormal and inconclusive findings on diagnostic imaging of breast: Secondary | ICD-10-CM

## 2023-05-21 ENCOUNTER — Ambulatory Visit
Admission: RE | Admit: 2023-05-21 | Discharge: 2023-05-21 | Disposition: A | Discharge status: Home / Self Care | Source: Ambulatory Visit | Attending: Physician Assistant | Admitting: Physician Assistant

## 2023-05-21 DIAGNOSIS — R928 Other abnormal and inconclusive findings on diagnostic imaging of breast: Secondary | ICD-10-CM

## 2023-05-22 NOTE — Progress Notes (Unsigned)
 Follow Up Note  RE: Ann Valenzuela MRN: [id number] DOB: 01/20/1966 Date of Office Visit: 05/23/2023  Referring provider: Milus Height, PA Primary care provider: Milus Height, PA  Chief Complaint: No chief complaint on file.  History of Present Illness: I had the pleasure of seeing Ann Valenzuela for a follow up visit at the [facility] on 05/22/2023. She is a 58 y.o. female, who is being followed for asthma, allergic rhinoconjunctivitis and GERD. Her previous allergy office visit was on November 22, 2022 with Dr. Selena Batten. Today is a regular follow up visit.  Discussed the use of AI scribe software for clinical note transcription with the patient, who gave verbal consent to proceed.  History of Present Illness            ***  Assessment and Plan: Ann Valenzuela is a 58 y.o. female with: Moderate persistent asthma without complication Past history - Usually flares in spring and fall. Took prednisone in March with good benefit. Symbicort too expensive. Levoalbuterol works better than albuterol. Interim history - Seasonal exacerbations managed with Flovent 1 puff BID. Rare use of rescue inhaler. No ER visits or need for systemic steroids. Daily controller medication(s):  In the spring and fall START Flovent 1 puff twice a day and rinse mouth afterwards.  Continue Singulair (montelukast) 10mg  daily at night. During respiratory infections/flares:  Start Flovent 2 puffs twice a day for 1-2 weeks until your breathing symptoms return to baseline.  May use levoalbuterol rescue inhaler 2 puffs or nebulizer every 4 to 6 hours as needed for shortness of breath, chest tightness, coughing, and wheezing. Monitor frequency of use.  Get spirometry at next visit.   Other allergic rhinitis Allergic conjunctivitis of both eyes Past history - Rhinoconjunctivitis symptoms for 20+ years mainly in the spring and fall. Last skin testing was over 7 years ago which  showed multiple positives per patient report.  Allergy injections in [place] from 2007-2010 with unknown benefit. Records scanned. Wears CPAP at night.  Interim history - Managed with montelukast, Claritin, and occasional use of Flonase. Continue environmental control measures as below. Use over the counter antihistamines such as Zyrtec (cetirizine), Claritin (loratadine), Allegra (fexofenadine), or Xyzal (levocetirizine) daily as needed. May take twice a day during allergy flares. May switch antihistamines every few months. Continue with montelukast (Singulair) 10mg  daily.  Use azelastine nasal spray 1-2 sprays per nostril twice a day as needed for runny nose/drainage. Use Flonase (fluticasone) nasal spray 1-2 sprays per nostril once a day as needed for nasal congestion.  Nasal saline spray (i.e., Simply Saline) or nasal saline lavage (i.e., NeilMed) is recommended as needed and prior to medicated nasal sprays. Use cromolyn 4% 1 drop in each eye up to four times a day as needed for itchy/watery eyes.    Gastroesophageal reflux disease, unspecified whether esophagitis present Continue lifestyle and dietary modifications. Decrease omeprazole to 20mg  once day - nothing to eat or drink for 20-30 minutes afterwards.  If the 20mg  does not control symptoms let us know and will send in the 40mg  dose.  Assessment and Plan              No follow-ups on file.  No orders of the defined types were placed in this encounter.  Lab Orders  No laboratory test(s) ordered today    Diagnostics: Spirometry:  Tracings reviewed. Her effort: {Blank single:19197::"Good reproducible efforts.","It was hard to get consistent efforts and there is a question as to whether  this reflects a maximal maneuver.","Poor effort, data can not be interpreted."} FVC: ***L FEV1: ***L, ***% predicted FEV1/FVC ratio: ***% Interpretation: {Blank single:19197::"Spirometry consistent with mild obstructive disease","Spirometry  consistent with moderate obstructive disease","Spirometry consistent with severe obstructive disease","Spirometry consistent with possible restrictive disease","Spirometry consistent with mixed obstructive and restrictive disease","Spirometry uninterpretable due to technique","Spirometry consistent with normal pattern","No overt abnormalities noted given today's efforts"}.  Please see scanned spirometry results for details.  Skin Testing: {Blank single:19197::"Select foods","Environmental allergy panel","Environmental allergy panel and select foods","Food allergy panel","None","Deferred due to recent antihistamines use"}. *** Results discussed with patient/family.   Medication List:  Current Outpatient Medications  Medication Sig Dispense Refill  . atorvastatin (LIPITOR) 10 MG tablet Take 10 mg by mouth daily.    Marland Kitchen azelastine (ASTELIN) 0.1 % nasal spray Place 2 sprays into both nostrils 2 (two) times daily. 30 mL 1  . cromolyn (OPTICROM) 4 % ophthalmic solution Place 1 drop into both eyes 4 (four) times daily as needed (itchy/watery eyes). 10 mL 3  . fenofibrate (TRICOR) 145 MG tablet Take 1 tablet by mouth daily.    . fluticasone (FLOVENT HFA) 110 MCG/ACT inhaler Inhale 2 puffs into the lungs in the morning and at bedtime. with spacer and rinse mouth afterwards. 1 each 5  . levalbuterol (XOPENEX HFA) 45 MCG/ACT inhaler Inhale 1-2 puffs into the lungs every 6 (six) hours as needed for wheezing or shortness of breath. 1 each 12  . levalbuterol (XOPENEX) 0.31 MG/3ML nebulizer solution Take 3 mLs (0.31 mg total) by nebulization every 6 (six) hours as needed for wheezing or shortness of breath. 75 mL 1  . levocetirizine (XYZAL) 5 MG tablet     . loratadine (CLARITIN) 10 MG tablet Take 10 mg by mouth daily.    Marland Kitchen losartan (COZAAR) 25 MG tablet Take 25 mg by mouth daily.    . montelukast (SINGULAIR) 10 MG tablet TAKE 1 TABLET(10 MG) BY MOUTH AT BEDTIME 90 tablet 0  . omeprazole (PRILOSEC) 20 MG  capsule Take 1 capsule (20 mg total) by mouth daily. 30 capsule 5   No current facility-administered medications for this visit.   Allergies: Allergies  Allergen Reactions  . Albuterol Hypertension  . Hydrochlorothiazide Cough  . Lisinopril Cough  . Other Other (See Comments)    Seasonal allergies  Other reaction(s): Unknown   I reviewed her past medical history, social history, family history, and environmental history and no significant changes have been reported from her previous visit.  Review of Systems  Constitutional:  Negative for appetite change, chills, fever and unexpected weight change.  HENT:  Negative for congestion, rhinorrhea and sneezing.   Eyes:  Negative for itching.  Respiratory:  Negative for cough, chest tightness, shortness of breath and wheezing.   Cardiovascular:  Negative for chest pain.  Gastrointestinal:  Negative for abdominal pain.  Genitourinary:  Negative for difficulty urinating.  Skin:  Negative for rash.  Allergic/Immunologic: Positive for environmental allergies.  Neurological:  Negative for headaches.   Objective: There were no vitals taken for this visit. There is no height or weight on file to calculate BMI. Physical Exam Vitals and nursing note reviewed.  Constitutional:      Appearance: Normal appearance. She is well-developed.  HENT:     Head: Normocephalic and atraumatic.     Right Ear: External ear normal.     Left Ear: External ear normal.     Nose: Nose normal.     Mouth/Throat:     Mouth: Mucous membranes are moist.  Pharynx: Oropharynx is clear.  Eyes:     Conjunctiva/sclera: Conjunctivae normal.  Cardiovascular:     Rate and Rhythm: Normal rate and regular rhythm.     Heart sounds: Normal heart sounds. No murmur heard.    No friction rub. No gallop.  Pulmonary:     Effort: Pulmonary effort is normal.     Breath sounds: Normal breath sounds. No wheezing, rhonchi or rales.  Abdominal:     Palpations: Abdomen is  soft.  Musculoskeletal:     Cervical back: Neck supple.  Skin:    General: Skin is warm.     Findings: No rash.  Neurological:     Mental Status: She is alert and oriented to person, place, and time.  Psychiatric:        Behavior: Behavior normal.  Previous notes and tests were reviewed. The plan was reviewed with the patient/family, and all questions/concerned were addressed.  It was my pleasure to see Ann Valenzuela today and participate in her care. Please feel free to contact me with any questions or concerns.  Sincerely,  Wyline Mood, DO Allergy & Immunology  [facility] office: [phone] [facility] office: [phone]

## 2023-05-23 ENCOUNTER — Ambulatory Visit: Payer: 59 | Admitting: Allergy

## 2023-05-23 ENCOUNTER — Other Ambulatory Visit: Payer: Self-pay

## 2023-05-23 ENCOUNTER — Encounter: Payer: Self-pay | Admitting: Allergy

## 2023-05-23 VITALS — BP 126/82 | HR 75 | Temp 98.6°F | Resp 17

## 2023-05-23 DIAGNOSIS — K219 Gastro-esophageal reflux disease without esophagitis: Secondary | ICD-10-CM | POA: Diagnosis not present

## 2023-05-23 DIAGNOSIS — H1013 Acute atopic conjunctivitis, bilateral: Secondary | ICD-10-CM | POA: Diagnosis not present

## 2023-05-23 DIAGNOSIS — J3089 Other allergic rhinitis: Secondary | ICD-10-CM

## 2023-05-23 DIAGNOSIS — J4540 Moderate persistent asthma, uncomplicated: Secondary | ICD-10-CM

## 2023-05-23 DIAGNOSIS — J4530 Mild persistent asthma, uncomplicated: Secondary | ICD-10-CM

## 2023-05-23 MED ORDER — RYALTRIS 665-25 MCG/ACT NA SUSP
1.0000 | Freq: Two times a day (BID) | NASAL | Status: AC
Start: 2023-05-23 — End: ?

## 2023-05-23 MED ORDER — MONTELUKAST SODIUM 10 MG PO TABS: 10.0000 mg | ORAL_TABLET | Freq: Every day | ORAL | 3 refills | Status: AC

## 2023-05-23 MED ORDER — AZELASTINE HCL 0.05 % OP SOLN: 1.0000 [drp] | Freq: Two times a day (BID) | OPHTHALMIC | 5 refills | Status: AC | PRN

## 2023-05-23 MED ORDER — LEVALBUTEROL TARTRATE 45 MCG/ACT IN AERO: 2.0000 | INHALATION_SPRAY | RESPIRATORY_TRACT | 2 refills | Status: AC | PRN

## 2023-05-23 MED ORDER — OMEPRAZOLE 40 MG PO CPDR: 40.0000 mg | DELAYED_RELEASE_CAPSULE | Freq: Every day | ORAL | 2 refills | Status: DC

## 2023-05-23 MED ORDER — FLUTICASONE PROPIONATE HFA 110 MCG/ACT IN AERO: 2.0000 | INHALATION_SPRAY | Freq: Two times a day (BID) | RESPIRATORY_TRACT | 5 refills | Status: AC

## 2023-05-23 MED ORDER — RYALTRIS 665-25 MCG/ACT NA SUSP: 1.0000 | Freq: Two times a day (BID) | NASAL | 5 refills | Status: AC

## 2023-05-23 NOTE — Progress Notes (Signed)
 Medication Samples have been provided to the patient.  Drug name: Ryaltris       Strength: 634mcg/25mcg        Qty: 1  LOT: [id number]  Exp.Date: 05/20/2024  Dosing instructions: 1-2 sprays per nostril twice a day  The patient has been instructed regarding the correct time, dose, and frequency of taking this medication, including desired effects and most common side effects.   Dub Mikes 11:02 AM 05/23/2023

## 2023-05-23 NOTE — Patient Instructions (Addendum)
 Asthma: Daily controller medication(s):  In the spring and fall START Flovent 2 puffs twice a day and rinse mouth afterwards.  Continue Singulair (montelukast) 10mg  daily at night. During respiratory infections/flares:  Start Flovent 2 puffs twice a day for 1-2 weeks until your breathing symptoms return to baseline.  May use levoalbuterol rescue inhaler 2 puffs or nebulizer every 4 to 6 hours as needed for shortness of breath, chest tightness, coughing, and wheezing. Monitor frequency of use.  Asthma control goals:  Full participation in all desired activities (may need albuterol before activity) Albuterol use two times or less a week on average (not counting use with activity) Cough interfering with sleep two times or less a month Oral steroids no more than once a year No hospitalizations   Environmental allergies Continue environmental control measures. Use over the counter antihistamines such as Zyrtec (cetirizine), Claritin (loratadine), Allegra (fexofenadine), or Xyzal (levocetirizine) daily as needed. May take twice a day during allergy flares. May switch antihistamines every few months. Continue with montelukast (singulair) 10mg  daily.  Start Ryaltris (olopatadine + mometasone nasal spray combination) 1-2 sprays per nostril twice a day. Sample given. This replaces your other nasal sprays. If this works well for you, then have pharmacy ship the medication to your home - prescription already sent in.  Nasal saline spray (i.e., Simply Saline) or nasal saline lavage (i.e., NeilMed) is recommended as needed and prior to medicated nasal sprays. Use Optivar (azelastine) eye drops 0.05% twice a day as needed for itchy/watery eyes. Wait 10-15 minutes before putting your contact lens in.  Reflux Continue lifestyle and dietary modifications. Go back on omeprazole 40mg  once day - nothing to eat or drink for 20-30 minutes afterwards.   Follow up in 8 months or sooner if needed.   Return for allergy skin testing in December.  Will make additional recommendations based on results. Make sure you don't take any antihistamines for 3 days before the skin testing appointment. Don't put any lotion on the back and arms on the day of testing.  Plan on being here for 30-60 minutes.   Reducing Pollen Exposure Pollen seasons: trees (spring), grass (summer) and ragweed/weeds (fall). Keep windows closed in your home and car to lower pollen exposure.  Install air conditioning in the bedroom and throughout the house if possible.  Avoid going out in dry windy days - especially early morning. Pollen counts are highest between 5 - 10 AM and on dry, hot and windy days.  Save outside activities for late afternoon or after a heavy rain, when pollen levels are lower.  Avoid mowing of grass if you have grass pollen allergy. Be aware that pollen can also be transported indoors on people and pets.  Dry your clothes in an automatic dryer rather than hanging them outside where they might collect pollen.  Rinse hair and eyes before bedtime.

## 2024-01-30 ENCOUNTER — Encounter: Payer: Self-pay | Admitting: Allergy

## 2024-01-30 ENCOUNTER — Ambulatory Visit: Admitting: Allergy

## 2024-01-30 DIAGNOSIS — K219 Gastro-esophageal reflux disease without esophagitis: Secondary | ICD-10-CM

## 2024-01-30 DIAGNOSIS — R21 Rash and other nonspecific skin eruption: Secondary | ICD-10-CM

## 2024-01-30 DIAGNOSIS — J4530 Mild persistent asthma, uncomplicated: Secondary | ICD-10-CM

## 2024-01-30 DIAGNOSIS — J3089 Other allergic rhinitis: Secondary | ICD-10-CM

## 2024-01-30 NOTE — Progress Notes (Signed)
 Skin testing note  RE: Ann Valenzuela MRN: [id number] DOB: [birth year] Date of Office Visit: 01/30/2024  Referring provider: Redmon, Noelle, PA Primary care provider: Redmon, Noelle, PA  Chief Complaint: skin testing  History of Present Illness: I had the pleasure of seeing Ann Valenzuela for a skin testing visit at the [facility] on 01/30/2024. She is a 58 y.o. female, who is being followed for asthma, allergic rhino conjunctivitis, GERD. Her previous allergy office visit was on 05/22/3033 with Dr. Luke. Today is a skin testing visit.   Discussed the use of AI scribe software for clinical note transcription with the patient, who gave verbal consent to proceed.    She has positive test results for grass, ragweed, and tree pollen allergies. Her current allergy management includes daily Zyrtec, Flonase  nasal spray, and montelukast . She also uses eye drops as needed, although this time of year is not particularly severe for her symptoms.  Her asthma is managed with Flovent  during spring and fall, and she uses a rescue inhaler as needed. She takes Singulair  daily, which aids in controlling both asthma and allergies.  She has a history of reflux, for which she takes omeprazole  once daily.  She mentions experiencing a rash on her face, which she suspects might be related to her CPAP machine. She uses nose pillows with straps and has tried changing the straps and washing them with baby shampoo. The rash is present on both sides of her face.     Assessment and Plan: Ann Valenzuela is a 58 y.o. female with: Other allergic rhinitis Past history - Rhinoconjunctivitis symptoms for 20+ years mainly in the spring and fall. Last skin testing was over 7 years ago which showed multiple positives per patient report.  Allergy injections in [place]  from 2007-2010 with unknown benefit. Records scanned. Wears CPAP at night.  01/30/2024 skin testing positive to grass, ragweed, trees. Continue  environmental control measures. Use over the counter antihistamines such as Zyrtec (cetirizine), Claritin (loratadine), Allegra (fexofenadine), or Xyzal (levocetirizine) daily as needed. May take twice a day during allergy flares. May switch antihistamines every few months. Continue with montelukast  (Singulair ) 10mg  daily.  Use Flonase  (fluticasone ) nasal spray 1-2 sprays per nostril once a day as needed for nasal congestion.  Nasal saline spray (i.e., Simply Saline) or nasal saline lavage (i.e., NeilMed) is recommended as needed and prior to medicated nasal sprays. Use Optivar  (azelastine ) eye drops 0.05% twice a day as needed for itchy/watery eyes. Wait 10-15 minutes before putting your contact lens in. Recommend allergy injections. 1 shot Let us  know when ready to start.  Had a detailed discussion with patient/family that clinical history is suggestive of allergic rhinitis, and may benefit from allergy immunotherapy (AIT). Discussed in detail regarding the dosing, schedule, side effects (mild to moderate local allergic reaction and rarely systemic allergic reactions including anaphylaxis), and benefits (significant improvement in nasal symptoms, seasonal flares of asthma) of immunotherapy with the patient. There is significant time commitment involved with allergy shots, which includes weekly immunotherapy injections for first 9-12 months and then biweekly to monthly injections for 3-5 years. Consent signed.   Mild persistent asthma without complication Past history - Usually flares in spring and fall. Took prednisone in March with good benefit. Symbicort  too expensive. Levoalbuterol works better than albuterol . Daily controller medication(s):  In the spring and fall START Flovent  110mcg 2 puffs twice a day and rinse mouth afterwards.  Continue Singulair  (montelukast ) 10mg  daily at night. During respiratory infections/flares:  Start  Flovent  110mcg 2 puffs twice a day for 1-2 weeks until your  breathing symptoms return to baseline.  May use levoalbuterol rescue inhaler 2 puffs or nebulizer every 4 to 6 hours as needed for shortness of breath, chest tightness, coughing, and wheezing. Monitor frequency of use.   Gastroesophageal reflux disease, unspecified whether esophagitis present Continue lifestyle and dietary modifications. Omeprazole  40mg  once day - nothing to eat or drink for 20-30 minutes afterwards.   Rash and other nonspecific skin eruption Check with sleep medicine about changing the mask. If you are still breaking out then we can consider doing patch testing next but sometimes I don't have the exact materials that are in certain things . Patches are best placed on Monday with return to office on Wednesday and Friday of same week for readings.  Patches once placed should not get wet.  You do not have to stop any medications for patch testing but should not be on oral prednisone. You can schedule a patch testing visit when convenient for your schedule.    Return in about 6 months (around 07/30/2024).  No orders of the defined types were placed in this encounter.  Lab Orders  No laboratory test(s) ordered today    Diagnostics: Skin Testing: Environmental allergy panel. 01/30/2024 skin testing positive to grass, ragweed, trees. Results discussed with patient/family.  Airborne Adult Perc - 01/30/24 0936     Time Antigen Placed 9063    Allergen Manufacturer Jestine    Location Back    Number of Test 55    1. Control-Buffer 50% Glycerol Negative    2. Control-Histamine 3+    3. Bahia Negative    4. Bermuda Negative    5. Johnson Negative    6. [place]  Blue Negative    7. Meadow Fescue Negative    8. Perennial Rye Negative    9. Timothy Negative    10. Ragweed Mix 3+    11. Cocklebur Negative    12. Plantain,  English Negative    13. Baccharis Negative    14. Dog Fennel Negative    15. Russian Thistle Negative    16. Lamb's Quarters Negative    17. Sheep  Sorrell Negative    18. Rough Pigweed Negative    19. Marsh Elder, Rough Negative    20. Mugwort, Common Negative    21. Box, Elder Negative    22. Cedar, red Negative    23. Sweet Gum Negative    24. Pecan Pollen Negative    25. Pine Mix Negative    26. Walnut, Black Pollen Negative    27. Red Mulberry Negative    28. Ash Mix Negative    29. Birch Mix Negative    30. Beech American Negative    31. Cottonwood, Eastern Negative    32. Hickory, White Negative    33. Maple Mix Negative    34. Oak, Eastern Mix Negative    35. Sycamore Eastern Negative    36. Alternaria Alternata Negative    37. Cladosporium Herbarum Negative    38. Aspergillus Mix Negative    39. Penicillium Mix Negative    40. Bipolaris Sorokiniana (Helminthosporium) Negative    41. Drechslera Spicifera (Curvularia) Negative    42. Mucor Plumbeus Negative    43. Fusarium Moniliforme Negative    44. Aureobasidium Pullulans (pullulara) Negative    45. Rhizopus Oryzae Negative    46. Botrytis Cinera Negative    47. Epicoccum Nigrum Negative    48. Phoma Betae  Negative    49. Dust Mite Mix Negative    50. Cat Hair 10,000 BAU/ml Negative    51.  Dog Epithelia Negative    52. Mixed Feathers Negative    53. Horse Epithelia Negative    54. Cockroach, German Negative    55. Tobacco Leaf Negative          Intradermal - 01/30/24 1001     Time Antigen Placed 1001    Allergen Manufacturer Jestine    Location Arm    Number of Test 15    Intradermal Select    Control Negative    Bahia 2+    Bermuda Negative    Johnson Negative    7 Grass Negative    Weed Mix Negative    Tree Mix 2+    Mold 1 Negative    Mold 2 Negative    Mold 3 Negative    Mold 4 Negative    Mite Mix Negative    Cat Negative    Dog Negative    Cockroach Negative          Previous notes and tests were reviewed. The plan was reviewed with the patient/family, and all questions/concerned were addressed.  It was my pleasure to see  Zorana today and participate in her care. Please feel free to contact me with any questions or concerns.  Sincerely,  Orlan Cramp, DO Allergy & Immunology  [facility]  [place] office: [phone] [facility] office: [phone]

## 2024-01-30 NOTE — Patient Instructions (Addendum)
 01/30/2024 skin testing positive to grass, ragweed, trees.  Results given.  Environmental allergies Continue environmental control measures. Use over the counter antihistamines such as Zyrtec (cetirizine), Claritin (loratadine), Allegra (fexofenadine), or Xyzal (levocetirizine) daily as needed. May take twice a day during allergy flares. May switch antihistamines every few months. Continue with montelukast  (singulair ) 10mg  daily.  Use Flonase  (fluticasone ) nasal spray 1-2 sprays per nostril once a day as needed for nasal congestion.  Nasal saline spray (i.e., Simply Saline) or nasal saline lavage (i.e., NeilMed) is recommended as needed and prior to medicated nasal sprays. Use Optivar  (azelastine ) eye drops 0.05% twice a day as needed for itchy/watery eyes. Wait 10-15 minutes before putting your contact lens in. Recommend allergy injections. 1 shot Let us  know when ready to start.  Had a detailed discussion with patient/family that clinical history is suggestive of allergic rhinitis, and may benefit from allergy immunotherapy (AIT). Discussed in detail regarding the dosing, schedule, side effects (mild to moderate local allergic reaction and rarely systemic allergic reactions including anaphylaxis), and benefits (significant improvement in nasal symptoms, seasonal flares of asthma) of immunotherapy with the patient. There is significant time commitment involved with allergy shots, which includes weekly immunotherapy injections for first 9-12 months and then biweekly to monthly injections for 3-5 years. Consent signed.   Asthma: Daily controller medication(s):  In the spring and fall START Flovent  110mcg 2 puffs twice a day and rinse mouth afterwards.  Continue Singulair  (montelukast ) 10mg  daily at night. During respiratory infections/flares:  Start Flovent  110mcg 2 puffs twice a day for 1-2 weeks until your breathing symptoms return to baseline.  May use levoalbuterol rescue inhaler 2 puffs or  nebulizer every 4 to 6 hours as needed for shortness of breath, chest tightness, coughing, and wheezing. Monitor frequency of use.  Asthma control goals:  Full participation in all desired activities (may need albuterol  before activity) Albuterol  use two times or less a week on average (not counting use with activity) Cough interfering with sleep two times or less a month Oral steroids no more than once a year No hospitalizations   Reflux Continue lifestyle and dietary modifications. Omeprazole  40mg  once day - nothing to eat or drink for 20-30 minutes afterwards.   Rash Check with sleep doctor about changing the mask. If you are still breaking out then we can consider doing patch testing next but sometimes I don't have the exact materials that are in certain things . Patches are best placed on Monday with return to office on Wednesday and Friday of same week for readings.  Patches once placed should not get wet.  You do not have to stop any medications for patch testing but should not be on oral prednisone. You can schedule a patch testing visit when convenient for your schedule.    Follow up in 6 months or sooner if needed.   Reducing Pollen Exposure Pollen seasons: trees (spring), grass (summer) and ragweed/weeds (fall). Keep windows closed in your home and car to lower pollen exposure.  Install air conditioning in the bedroom and throughout the house if possible.  Avoid going out in dry windy days - especially early morning. Pollen counts are highest between 5 - 10 AM and on dry, hot and windy days.  Save outside activities for late afternoon or after a heavy rain, when pollen levels are lower.  Avoid mowing of grass if you have grass pollen allergy. Be aware that pollen can also be transported indoors on people and pets.  Dry your  clothes in an automatic dryer rather than hanging them outside where they might collect pollen.  Rinse hair and eyes before bedtime.

## 2024-02-11 ENCOUNTER — Other Ambulatory Visit: Payer: Self-pay | Admitting: Allergy

## 2024-03-10 ENCOUNTER — Other Ambulatory Visit: Payer: Self-pay | Admitting: Allergy

## 2024-07-30 ENCOUNTER — Ambulatory Visit: Admitting: Allergy
